# Patient Record
Sex: Female | Born: 1975 | Race: White | Hispanic: No | Marital: Married | Smoking: Current every day smoker
Health system: Southern US, Community
[De-identification: ages and names within clinical notes are randomized; demographics above are authoritative.]

## PROBLEM LIST (undated history)

## (undated) DIAGNOSIS — I10 Essential (primary) hypertension: Secondary | ICD-10-CM

## (undated) DIAGNOSIS — R002 Palpitations: Secondary | ICD-10-CM

## (undated) DIAGNOSIS — F32A Depression, unspecified: Secondary | ICD-10-CM

## (undated) DIAGNOSIS — D649 Anemia, unspecified: Secondary | ICD-10-CM

## (undated) DIAGNOSIS — T4145XA Adverse effect of unspecified anesthetic, initial encounter: Secondary | ICD-10-CM

## (undated) DIAGNOSIS — Z8619 Personal history of other infectious and parasitic diseases: Secondary | ICD-10-CM

## (undated) DIAGNOSIS — F419 Anxiety disorder, unspecified: Secondary | ICD-10-CM

## (undated) DIAGNOSIS — M2550 Pain in unspecified joint: Secondary | ICD-10-CM

## (undated) DIAGNOSIS — T7840XA Allergy, unspecified, initial encounter: Secondary | ICD-10-CM

## (undated) DIAGNOSIS — R519 Headache, unspecified: Secondary | ICD-10-CM

## (undated) DIAGNOSIS — F329 Major depressive disorder, single episode, unspecified: Secondary | ICD-10-CM

## (undated) DIAGNOSIS — J40 Bronchitis, not specified as acute or chronic: Secondary | ICD-10-CM

## (undated) DIAGNOSIS — M779 Enthesopathy, unspecified: Secondary | ICD-10-CM

## (undated) DIAGNOSIS — R51 Headache: Secondary | ICD-10-CM

## (undated) DIAGNOSIS — T8859XA Other complications of anesthesia, initial encounter: Secondary | ICD-10-CM

## (undated) HISTORY — DX: Essential (primary) hypertension: I10

## (undated) HISTORY — DX: Pain in unspecified joint: M25.50

## (undated) HISTORY — DX: Personal history of other infectious and parasitic diseases: Z86.19

## (undated) HISTORY — DX: Unspecified porphyria: E80.20

## (undated) HISTORY — PX: WISDOM TOOTH EXTRACTION: SHX21

## (undated) HISTORY — PX: BREAST REDUCTION SURGERY: SHX8

## (undated) HISTORY — DX: Anemia, unspecified: D64.9

## (undated) HISTORY — DX: Allergy, unspecified, initial encounter: T78.40XA

## (undated) HISTORY — PX: TYMPANOSTOMY TUBE PLACEMENT: SHX32

## (undated) HISTORY — DX: Enthesopathy, unspecified: M77.9

---

## 2015-09-14 DIAGNOSIS — M79671 Pain in right foot: Secondary | ICD-10-CM | POA: Diagnosis not present

## 2015-09-14 DIAGNOSIS — S92501A Displaced unspecified fracture of right lesser toe(s), initial encounter for closed fracture: Secondary | ICD-10-CM | POA: Diagnosis not present

## 2015-09-14 DIAGNOSIS — M25571 Pain in right ankle and joints of right foot: Secondary | ICD-10-CM | POA: Diagnosis not present

## 2015-09-14 DIAGNOSIS — S99921A Unspecified injury of right foot, initial encounter: Secondary | ICD-10-CM | POA: Diagnosis not present

## 2015-09-14 DIAGNOSIS — W108XXA Fall (on) (from) other stairs and steps, initial encounter: Secondary | ICD-10-CM | POA: Diagnosis not present

## 2015-09-18 DIAGNOSIS — S92514A Nondisplaced fracture of proximal phalanx of right lesser toe(s), initial encounter for closed fracture: Secondary | ICD-10-CM | POA: Diagnosis not present

## 2015-09-18 DIAGNOSIS — S92414A Nondisplaced fracture of proximal phalanx of right great toe, initial encounter for closed fracture: Secondary | ICD-10-CM | POA: Diagnosis not present

## 2015-10-31 DIAGNOSIS — S92501A Displaced unspecified fracture of right lesser toe(s), initial encounter for closed fracture: Secondary | ICD-10-CM | POA: Diagnosis not present

## 2015-12-05 DIAGNOSIS — Z23 Encounter for immunization: Secondary | ICD-10-CM | POA: Diagnosis not present

## 2015-12-10 ENCOUNTER — Encounter: Payer: Self-pay | Admitting: General Practice

## 2016-03-10 DIAGNOSIS — Z23 Encounter for immunization: Secondary | ICD-10-CM | POA: Diagnosis not present

## 2016-03-19 ENCOUNTER — Ambulatory Visit (INDEPENDENT_AMBULATORY_CARE_PROVIDER_SITE_OTHER): Payer: BLUE CROSS/BLUE SHIELD | Admitting: Family Medicine

## 2016-03-19 ENCOUNTER — Encounter: Payer: Self-pay | Admitting: Family Medicine

## 2016-03-19 DIAGNOSIS — Z720 Tobacco use: Secondary | ICD-10-CM | POA: Diagnosis not present

## 2016-03-19 DIAGNOSIS — R635 Abnormal weight gain: Secondary | ICD-10-CM

## 2016-03-19 DIAGNOSIS — N920 Excessive and frequent menstruation with regular cycle: Secondary | ICD-10-CM

## 2016-03-19 LAB — CBC WITH DIFFERENTIAL/PLATELET
BASOS PCT: 1.3 % (ref 0.0–3.0)
Basophils Absolute: 0.1 10*3/uL (ref 0.0–0.1)
Eosinophils Absolute: 0.1 10*3/uL (ref 0.0–0.7)
Eosinophils Relative: 0.9 % (ref 0.0–5.0)
HCT: 34.5 % — ABNORMAL LOW (ref 36.0–46.0)
Hemoglobin: 10.9 g/dL — ABNORMAL LOW (ref 12.0–15.0)
LYMPHS ABS: 1.7 10*3/uL (ref 0.7–4.0)
Lymphocytes Relative: 28.2 % (ref 12.0–46.0)
MCHC: 31.5 g/dL (ref 30.0–36.0)
MCV: 79.8 fl (ref 78.0–100.0)
MONO ABS: 0.5 10*3/uL (ref 0.1–1.0)
Monocytes Relative: 8.1 % (ref 3.0–12.0)
NEUTROS PCT: 61.5 % (ref 43.0–77.0)
Neutro Abs: 3.8 10*3/uL (ref 1.4–7.7)
PLATELETS: 149 10*3/uL — AB (ref 150.0–400.0)
RBC: 4.33 Mil/uL (ref 3.87–5.11)
RDW: 18.2 % — AB (ref 11.5–15.5)
WBC: 6.1 10*3/uL (ref 4.0–10.5)

## 2016-03-19 LAB — HEPATIC FUNCTION PANEL
ALT: 29 U/L (ref 0–35)
AST: 22 U/L (ref 0–37)
Albumin: 4.1 g/dL (ref 3.5–5.2)
Alkaline Phosphatase: 63 U/L (ref 39–117)
BILIRUBIN DIRECT: 0.1 mg/dL (ref 0.0–0.3)
BILIRUBIN TOTAL: 0.6 mg/dL (ref 0.2–1.2)
Total Protein: 7 g/dL (ref 6.0–8.3)

## 2016-03-19 LAB — BASIC METABOLIC PANEL
BUN: 15 mg/dL (ref 6–23)
CHLORIDE: 110 meq/L (ref 96–112)
CO2: 26 meq/L (ref 19–32)
Calcium: 9.4 mg/dL (ref 8.4–10.5)
Creatinine, Ser: 0.64 mg/dL (ref 0.40–1.20)
GFR: 107.74 mL/min (ref >60.00)
Glucose, Bld: 83 mg/dL (ref 70–99)
POTASSIUM: 4.1 meq/L (ref 3.5–5.1)
Sodium: 139 mEq/L (ref 135–145)

## 2016-03-19 LAB — LIPID PANEL
CHOL/HDL RATIO: 3
Cholesterol: 191 mg/dL (ref 0–200)
HDL: 61.1 mg/dL (ref >39.00)
LDL Cholesterol: 116 mg/dL — ABNORMAL HIGH (ref 0–99)
NONHDL: 129.93
TRIGLYCERIDES: 70 mg/dL (ref 0.0–149.0)
VLDL: 14 mg/dL (ref 0.0–40.0)

## 2016-03-19 LAB — TSH: TSH: 0.83 u[IU]/mL (ref 0.35–4.50)

## 2016-03-19 NOTE — Patient Instructions (Addendum)
Schedule your complete physical in 6 months We'll notify you of your lab results and make any changes if needed Continue to work on healthy diet and regular exercise- you can do it! We'll call you with your GYN, Derm, nephrology and hematology appts Call with any questions or concerns Welcome!  We're glad to have you!!!

## 2016-03-19 NOTE — Progress Notes (Signed)
   Subjective:    Patient ID: Wendy ChanceJohanna Evans, female    DOB: [birth year], 41 y.o.   MRN: [id number]  HPI New to establish.  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  Previously taking Hydroxychloroquine but stopped.  Has not been seeing anyone recently.  Will rarely get abdominal pain.  Will have itching w/ sun exposure and then 'blood sugar tanks'.  Has some neuropathy of hands.  Weight gain- pt reports going from size 10/12 to 14/16 in a few months.  Not exercising regularly.  Tobacco use- smoking 1/2-1 ppd.  Smoking since age 41.  Pt has the understanding that smoking is helping her porphyria so she is reluctant to quit.   Menorrhagia- pt is interested in possible hysterectomy due to heavy periods and the fact that her porphyria sxs are worse w/ menstrual cycles.   Review of Systems For ROS see HPI     Objective:   Physical Exam  Constitutional: She is oriented to person, place, and time. She appears well-developed and well-nourished. No distress.  Overweight Smells of cigarette smoke  HENT:  Head: Normocephalic and atraumatic.  Eyes: Conjunctivae and EOM are normal. Pupils are equal, round, and reactive to light.  Neck: Normal range of motion. Neck supple. No thyromegaly present.  Cardiovascular: Normal rate, regular rhythm, normal heart sounds and intact distal pulses.   No murmur heard. Pulmonary/Chest: Effort normal and breath sounds normal. No respiratory distress.  Abdominal: Soft. She exhibits no distension. There is no tenderness.  Musculoskeletal: She exhibits no edema.  Lymphadenopathy:    She has no cervical adenopathy.  Neurological: She is alert and oriented to person, place, and time.  Skin: Skin is warm and dry.  Psychiatric: She has a normal mood and affect. Her behavior is normal.  Vitals reviewed.         Assessment & Plan:

## 2016-03-19 NOTE — Progress Notes (Signed)
Pre visit review using our clinic review tool, if applicable. No additional management support is needed unless otherwise documented below in the visit note. 

## 2016-03-23 NOTE — Assessment & Plan Note (Signed)
New to provider, ongoing for pt since age 41.  Pt has the idea that smoking is protective in her porphyria so she is reluctant to quit.  Stressed the need to do so.  Will follow.

## 2016-03-23 NOTE — Assessment & Plan Note (Signed)
New.  Pt admits to infrequent exercise and not following a particular diet.  Stressed the need for both.  Check labs to determine if she has a thyroid or metabolic abnormality contributing.  Will follow.

## 2016-03-23 NOTE — Assessment & Plan Note (Signed)
New to provider, ongoing for pt.  She has very heavy periods and her porphyria sxs are worse w/ menstrual cycles and hormonal fluctuations.  Referral to GYN placed.

## 2016-03-23 NOTE — Assessment & Plan Note (Signed)
New to provider, ongoing for pt.  She has not been seeing hematology or derm regularly for this condition.  She has skin lesions w/ sun exposure, labile blood sugars, and neuropathy due to her condition but thankfully, she rarely has abdominal pain.  I did not see any information about smoking being protective in this condition- in fact, I saw that it could actually trigger an attack.  But at this time, she is not willing to quit.  Refer to derm, nephrology, hematology for evaluation and ongoing tx.  Pt expressed understanding and is in agreement w/ plan.

## 2016-03-24 ENCOUNTER — Encounter: Payer: Self-pay | Admitting: Family Medicine

## 2016-03-31 DIAGNOSIS — J069 Acute upper respiratory infection, unspecified: Secondary | ICD-10-CM | POA: Diagnosis not present

## 2016-04-01 ENCOUNTER — Encounter: Payer: Self-pay | Admitting: Family Medicine

## 2016-04-08 ENCOUNTER — Telehealth: Payer: Self-pay | Admitting: Family Medicine

## 2016-04-08 NOTE — Telephone Encounter (Signed)
Dr Kathrene BongoGoldsborough called regarding pt's nephrology referral.  Due to normal creatinine, they don't feel that pt needs to be seen at this time.  We discussed that we will periodically monitor her labs and refer if and when we see a change in kidney function.

## 2016-04-09 NOTE — Telephone Encounter (Signed)
Patient notified of PCP recommendations and is agreement and expresses an understanding.  

## 2016-04-15 ENCOUNTER — Encounter: Payer: Self-pay | Admitting: Family Medicine

## 2016-04-15 DIAGNOSIS — Z881 Allergy status to other antibiotic agents status: Secondary | ICD-10-CM | POA: Diagnosis not present

## 2016-04-15 DIAGNOSIS — N921 Excessive and frequent menstruation with irregular cycle: Secondary | ICD-10-CM | POA: Diagnosis not present

## 2016-04-15 DIAGNOSIS — Z91040 Latex allergy status: Secondary | ICD-10-CM | POA: Diagnosis not present

## 2016-04-15 DIAGNOSIS — F17200 Nicotine dependence, unspecified, uncomplicated: Secondary | ICD-10-CM | POA: Diagnosis not present

## 2016-04-15 DIAGNOSIS — Z886 Allergy status to analgesic agent status: Secondary | ICD-10-CM | POA: Diagnosis not present

## 2016-04-15 DIAGNOSIS — Z888 Allergy status to other drugs, medicaments and biological substances status: Secondary | ICD-10-CM | POA: Diagnosis not present

## 2016-04-15 DIAGNOSIS — Z885 Allergy status to narcotic agent status: Secondary | ICD-10-CM | POA: Diagnosis not present

## 2016-04-15 DIAGNOSIS — Z880 Allergy status to penicillin: Secondary | ICD-10-CM | POA: Diagnosis not present

## 2016-04-15 DIAGNOSIS — Z882 Allergy status to sulfonamides status: Secondary | ICD-10-CM | POA: Diagnosis not present

## 2016-04-15 DIAGNOSIS — G629 Polyneuropathy, unspecified: Secondary | ICD-10-CM | POA: Diagnosis not present

## 2016-04-15 LAB — BASIC METABOLIC PANEL
BUN: 12 mg/dL (ref 4–21)
CREATININE: 0.7 mg/dL (ref 0.5–1.1)
GLUCOSE: 95 mg/dL
Potassium: 3.8 mmol/L (ref 3.4–5.3)
SODIUM: 139 mmol/L (ref 137–147)

## 2016-04-15 LAB — LIPID PANEL
Cholesterol: 174 mg/dL (ref 0–200)
HDL: 57 mg/dL (ref 35–70)
LDL CALC: 111 mg/dL
LDL/HDL RATIO: 3.1
Triglycerides: 75 mg/dL (ref 40–160)

## 2016-04-15 LAB — HEPATIC FUNCTION PANEL
ALT: 21 U/L (ref 7–35)
AST: 21 U/L (ref 13–35)
Alkaline Phosphatase: 60 U/L (ref 25–125)
BILIRUBIN, TOTAL: 0.5 mg/dL

## 2016-04-15 LAB — CBC AND DIFFERENTIAL
HEMATOCRIT: 33 % — AB (ref 36–46)
Hemoglobin: 10.1 g/dL — AB (ref 12.0–16.0)
PLATELETS: 166 10*3/uL (ref 150–399)
WBC: 7.4 10^3/mL

## 2016-04-15 LAB — POCT INR: INR: 1 (ref 0.9–1.1)

## 2016-04-16 ENCOUNTER — Telehealth: Payer: Self-pay | Admitting: Hematology & Oncology

## 2016-04-16 NOTE — Telephone Encounter (Signed)
Patient called after hours and left a message with [facility]. Patient wants to cancel appt with Dr. Myna HidalgoEnnever.       [facility] MARCH 2018  [facility]

## 2016-04-17 ENCOUNTER — Ambulatory Visit: Payer: BLUE CROSS/BLUE SHIELD

## 2016-04-17 ENCOUNTER — Ambulatory Visit: Payer: BLUE CROSS/BLUE SHIELD | Admitting: Hematology & Oncology

## 2016-04-17 ENCOUNTER — Other Ambulatory Visit: Payer: BLUE CROSS/BLUE SHIELD

## 2016-05-12 ENCOUNTER — Encounter: Payer: Self-pay | Admitting: General Practice

## 2016-05-18 DIAGNOSIS — N920 Excessive and frequent menstruation with regular cycle: Secondary | ICD-10-CM | POA: Diagnosis not present

## 2016-05-18 DIAGNOSIS — Z01419 Encounter for gynecological examination (general) (routine) without abnormal findings: Secondary | ICD-10-CM | POA: Diagnosis not present

## 2016-05-18 DIAGNOSIS — Z6827 Body mass index (BMI) 27.0-27.9, adult: Secondary | ICD-10-CM | POA: Diagnosis not present

## 2016-05-18 LAB — HM PAP SMEAR

## 2016-05-21 ENCOUNTER — Telehealth: Payer: Self-pay | Admitting: Family Medicine

## 2016-05-21 NOTE — Telephone Encounter (Signed)
Called pt and left a detailed message to inform. Advised her to possibly contact health department to see if they might have the vaccination without the neomycin.

## 2016-05-21 NOTE — Telephone Encounter (Signed)
Our Havrix does have trace elements of Neomycin so depending on reaction, she can't have this

## 2016-05-21 NOTE — Telephone Encounter (Signed)
Called patient as instructed to schedule appt for Hep A vaccine.  Patient has been schedule for 06/02/16.   However, she is asking if the Hep A vaccine contains neomycin or any mycin drugs or any other preservatives. She states she is severely allergic to medicines in the mycin family and needs to be sure this vaccine does not have any in it.

## 2016-06-02 ENCOUNTER — Ambulatory Visit: Payer: BLUE CROSS/BLUE SHIELD

## 2016-06-11 ENCOUNTER — Encounter: Payer: Self-pay | Admitting: Family Medicine

## 2016-06-19 DIAGNOSIS — N924 Excessive bleeding in the premenopausal period: Secondary | ICD-10-CM | POA: Diagnosis not present

## 2016-06-19 DIAGNOSIS — N946 Dysmenorrhea, unspecified: Secondary | ICD-10-CM | POA: Diagnosis not present

## 2016-06-24 ENCOUNTER — Telehealth: Payer: Self-pay | Admitting: *Deleted

## 2016-06-24 NOTE — Telephone Encounter (Signed)
Patient wanting to know if she still needs to go to Dermatology.  She states that she has seen Dr. Joellyn RuedBonkovsky and she is wondering if she still needs to see the dermatologist . She states  She  Tried sending a Clinical cytogeneticistmychart message and Wendy Reapermychart is down so she could not access it.

## 2016-06-24 NOTE — Telephone Encounter (Signed)
Called patient and LMOVM to return call.     

## 2016-06-24 NOTE — Telephone Encounter (Signed)
Patient returned call for information... Patient made aware and stated verbal understanding.

## 2016-06-24 NOTE — Telephone Encounter (Signed)
If she doesn't see the need to see Derm and things are going well with her porphyria specialist, she can cancel the appt.  If she has any areas of concern on her skin, it would be worthwhile to keep the appt

## 2016-06-30 DIAGNOSIS — D225 Melanocytic nevi of trunk: Secondary | ICD-10-CM | POA: Diagnosis not present

## 2016-06-30 DIAGNOSIS — L858 Other specified epidermal thickening: Secondary | ICD-10-CM | POA: Diagnosis not present

## 2016-06-30 DIAGNOSIS — D1800 Hemangioma unspecified site: Secondary | ICD-10-CM | POA: Diagnosis not present

## 2016-07-10 DIAGNOSIS — J40 Bronchitis, not specified as acute or chronic: Secondary | ICD-10-CM

## 2016-07-10 HISTORY — DX: Bronchitis, not specified as acute or chronic: J40

## 2016-07-15 ENCOUNTER — Ambulatory Visit (INDEPENDENT_AMBULATORY_CARE_PROVIDER_SITE_OTHER): Payer: BLUE CROSS/BLUE SHIELD | Admitting: Family Medicine

## 2016-07-15 ENCOUNTER — Encounter: Payer: Self-pay | Admitting: Family Medicine

## 2016-07-15 VITALS — BP 160/99 | HR 86 | Temp 98.5°F | Resp 20 | Wt 160.2 lb

## 2016-07-15 DIAGNOSIS — J40 Bronchitis, not specified as acute or chronic: Secondary | ICD-10-CM | POA: Diagnosis not present

## 2016-07-15 MED ORDER — IPRATROPIUM-ALBUTEROL 0.5-2.5 (3) MG/3ML IN SOLN
3.0000 mL | Freq: Once | RESPIRATORY_TRACT | Status: AC
  Administered 2016-07-15: 3 mL via RESPIRATORY_TRACT

## 2016-07-15 MED ORDER — PREDNISONE 50 MG PO TABS: ORAL_TABLET | ORAL | 0 refills | Status: DC

## 2016-07-15 MED ORDER — DOXYCYCLINE HYCLATE 100 MG PO TABS
100.0000 mg | ORAL_TABLET | Freq: Two times a day (BID) | ORAL | 0 refills | Status: DC
Start: 2016-07-15 — End: 2016-08-14

## 2016-07-15 NOTE — Patient Instructions (Signed)
Continue zyrtec and flonase.  You can use Mucinex DM for cough and phlegm production.  Prednisone with food for 5 days Doxycyline every 12 hours 10 days.     Acute Bronchitis, Adult Acute bronchitis is when air tubes (bronchi) in the lungs suddenly get swollen. The condition can make it hard to breathe. It can also cause these symptoms:  A cough.  Coughing up clear, yellow, or green mucus.  Wheezing.  Chest congestion.  Shortness of breath.  A fever.  Body aches.  Chills.  A sore throat.  Follow these instructions at home: Medicines  Take over-the-counter and prescription medicines only as told by your doctor.  If you were prescribed an antibiotic medicine, take it as told by your doctor. Do not stop taking the antibiotic even if you start to feel better. General instructions  Rest.  Drink enough fluids to keep your pee (urine) clear or pale yellow.  Avoid smoking and secondhand smoke. If you smoke and you need help quitting, ask your doctor. Quitting will help your lungs heal faster.  Use an inhaler, cool mist vaporizer, or humidifier as told by your doctor.  Keep all follow-up visits as told by your doctor. This is important. How is this prevented? To lower your risk of getting this condition again:  Wash your hands often with soap and water. If you cannot use soap and water, use hand sanitizer.  Avoid contact with people who have cold symptoms.  Try not to touch your hands to your mouth, nose, or eyes.  Make sure to get the flu shot every year.  Contact a doctor if:  Your symptoms do not get better in 2 weeks. Get help right away if:  You cough up blood.  You have chest pain.  You have very bad shortness of breath.  You become dehydrated.  You faint (pass out) or keep feeling like you are going to pass out.  You keep throwing up (vomiting).  You have a very bad headache.  Your fever or chills gets worse. This information is not intended  to replace advice given to you by your health care provider. Make sure you discuss any questions you have with your health care provider. Document Released: 07/15/2007 Document Revised: 09/04/2015 Document Reviewed: 07/17/2015 Elsevier Interactive Patient Education  2017 [employer]

## 2016-07-15 NOTE — Progress Notes (Signed)
Wendy Evans , 03-Mar-1975, 41 y.o., female MRN: [id number] Patient Care Team    Relationship Specialty Notifications Start End  Sheliah Hatchabori, Katherine E, MD PCP - General Family Medicine  03/19/16     Chief Complaint  Patient presents with  . URI    drainage,cough x 2 weeks      Subjective: Pt presents for an OV with complaints cough of 2 weeks duration.  Associated symptoms include last week had nasal drainage, sinus pain/pressure. Over the last few days has become more fatigued, hoarseness of voice and cough is productive. She denies fever, chills, nausea or vomit. Pt reports her allergies are worse this year. She is taking zyrtec. She reports she has coughing fits that can last quite a few minutes. She denies shortness of breath, wheezing or h/o asthma. She has many allergies.   [facility]/2016 03/19/2016  Decreased Interest - 0  Down, Depressed, Hopeless 1 0  PHQ - 2 Score 1 0  Altered sleeping - 0  Tired, decreased energy - 0  Change in appetite - 0  Feeling bad or failure about yourself  - 0  Trouble concentrating - 0  Moving slowly or fidgety/restless - 0  Suicidal thoughts - 0  PHQ-9 Score - 0    Allergies  Allergen Reactions  . Amoxicillin Nausea And Vomiting  . 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  . Propoxyphene Nausea And Vomiting  . Codeine   . Erythromycin   . Latex   . Meperidine Other (See Comments)  . Other     Darvocet   . Oxytocin Other (See Comments)  . Penicillins   . Sulfa Antibiotics   . Vancomycin Other (See Comments)   Social History  Substance Use Topics  . Smoking status: Current Every Day Smoker  . Smokeless tobacco: Never Used  . Alcohol use Yes   Past Medical History:  Diagnosis Date  . Allergy   . Anemia   . History of chicken pox   . Hypertension   . Joint pain   . Porphyria variegata (HCC)   . Tendonitis    Past Surgical History:  Procedure Laterality Date  . BREAST REDUCTION  SURGERY    . TYMPANOSTOMY TUBE PLACEMENT     Family History  Problem Relation Age of Onset  . Hypertension Maternal Grandmother   . Hyperlipidemia Maternal Grandmother   . Cancer Maternal Grandmother        skin  . Dementia Maternal Grandmother   . Heart disease Maternal Grandmother        valve replacement  . Hypertension Maternal Grandfather   . Diabetes Maternal Grandfather    Allergies as of 07/15/2016      Reactions   Amoxicillin Nausea And Vomiting   Nsaids Other (See Comments)   Because has Variegate Porphyria   Propoxyphene Nausea And Vomiting   Codeine    Erythromycin    Latex    Meperidine Other (See Comments)   Other    Darvocet   Oxytocin Other (See Comments)   Penicillins    Sulfa Antibiotics    Vancomycin Other (See Comments)      Medication List       Accurate as of 07/15/16 11:26 AM. Always use your most recent med list.          ferrous sulfate 325 (65 FE) MG tablet Take 325 mg by mouth daily with breakfast.   vitamin C 1000 MG tablet Take  1,000 mg by mouth daily.   VITAMIN D PO Take by mouth.       All past medical history, surgical history, allergies, family history, immunizations andmedications were updated in the EMR today and reviewed under the history and medication portions of their EMR.     ROS: Negative, with the exception of above mentioned in HPI   Objective:  BP (!) 160/99 (BP Location: Left Arm, Patient Position: Sitting, Cuff Size: Normal)   Pulse 86   Temp 98.5 F (36.9 C)   Resp 20   Wt 160 lb 4 oz (72.7 kg)   SpO2 99%   BMI 28.39 kg/m  Body mass index is 28.39 kg/m. Gen: Afebrile. No acute distress. Nontoxic in appearance, well developed, well nourished.  HENT: AT. [place]. Bilateral TM visualized Without erythema or fullness. MMM, no oral lesions. Bilateral nares without erythema, mild swelling, mild drainage. Throat without erythema or exudates. Moderate cough present, hoarseness present, tender to palpation bilateral  maxillary sinus. Eyes:Pupils Equal Round Reactive to light, Extraocular movements intact,  Conjunctiva without redness, discharge or icterus. Neck/lymp/endocrine: Supple, no lymphadenopathy CV: RRR Chest: Mild wheezing, moderate to severe cough with Inspiration. Difficult to appreciate air movement with cough. Abd: Soft. NTND. BS present.  Skin: No rashes, purpura or petechiae.  Neuro:  Normal gait. PERLA. EOMi. Alert. Oriented x3   No exam data present No results found. No results found for this or any previous visit (from the past 24 hour(s)).  Assessment/Plan: Wendy Evans is a 41 y.o. female present for OV for  Bronchitis - Rest, hydrate. Doxycycline twice a day 10 days, prednisone burst. -Patient DM and Flonase encouraged, as well as Zyrtec continue daily. -Patient had DuoNeb treatment in the office, which opened up her airways nicely, calmed down the cough and wheezing resolved. - She has some mild symptoms of sinusitis as well, just doxycycline to give coverage for both upper and lower respiratory tracts. - Follow-up with PCP in 1-2 weeks if not improving, sooner if worsening.   Reviewed expectations re: course of current medical issues.  Discussed self-management of symptoms.  Outlined signs and symptoms indicating need for more acute intervention.  Patient verbalized understanding and all questions were answered.  Patient received an After-Visit Summary.    Note is dictated utilizing voice recognition software. Although note has been proof read prior to signing, occasional typographical errors still can be missed. If any questions arise, please do not hesitate to call for verification.   electronically signed by:  Felix Pacini, DO  [facility] - [place]

## 2016-07-16 ENCOUNTER — Encounter: Payer: Self-pay | Admitting: Family Medicine

## 2016-07-24 ENCOUNTER — Encounter: Payer: Self-pay | Admitting: General Practice

## 2016-07-24 ENCOUNTER — Encounter: Payer: Self-pay | Admitting: Family Medicine

## 2016-07-24 DIAGNOSIS — Z1231 Encounter for screening mammogram for malignant neoplasm of breast: Secondary | ICD-10-CM | POA: Diagnosis not present

## 2016-07-24 LAB — HM MAMMOGRAPHY: HM Mammogram: NORMAL (ref 0–4)

## 2016-07-31 ENCOUNTER — Encounter: Payer: Self-pay | Admitting: General Practice

## 2016-08-06 NOTE — H&P (Signed)
41 year old female with menorrhagia. History of variegate porphyria.  Past Medical History:  Diagnosis Date  . Allergy   . Anemia   . History of chicken pox   . Hypertension   . Joint pain   . Porphyria variegata (HCC)   . Tendonitis    Past Surgical History:  Procedure Laterality Date  . BREAST REDUCTION SURGERY    . TYMPANOSTOMY TUBE PLACEMENT     Prior to Admission medications   Medication Sig Start Date End Date Taking? Authorizing Provider  Ascorbic Acid (VITAMIN C) 1000 MG tablet Take 1,000 mg by mouth daily.    [provider]  Cholecalciferol (VITAMIN D PO) Take by mouth.    [provider]  doxycycline (VIBRA-TABS) 100 MG tablet Take 1 tablet (100 mg total) by mouth 2 (two) times daily. 07/15/16   Kuneff, Renee A, DO  ferrous sulfate 325 (65 FE) MG tablet Take 325 mg by mouth daily with breakfast.    [provider]  predniSONE (DELTASONE) 50 MG tablet 1 tab daily with food. 07/15/16   Kuneff, Renee A, DO   Amoxicillin; Nsaids; Propoxyphene; Codeine; Erythromycin; Latex; Meperidine; Other; Oxytocin; Penicillins; Sulfa antibiotics; and Vancomycin  Family History  Problem Relation Age of Onset  . Hypertension Maternal Grandmother   . Hyperlipidemia Maternal Grandmother   . Cancer Maternal Grandmother        skin  . Dementia Maternal Grandmother   . Heart disease Maternal Grandmother        valve replacement  . Hypertension Maternal Grandfather   . Diabetes Maternal Grandfather    Social History   Social History  . Marital status: Married    Spouse name: N/A  . Number of children: N/A  . Years of education: N/A   Social History Main Topics  . Smoking status: Current Every Day Smoker  . Smokeless tobacco: Never Used  . Alcohol use Yes  . Drug use: No  . Sexual activity: No   Other Topics Concern  . Not on file   Social History Narrative  . No narrative on file   vss General alert and oriented Lung CTAB Car RRR Abdomen is soft  and non tender Pelvic WNL  IMPRESSION: Menorrhagia  PLAN: D and C, Hysteroscopy, HTA  Risks reviewed Consent signed

## 2016-08-11 NOTE — Patient Instructions (Signed)
Your procedure is scheduled on:  Friday, August 21, 2016  Enter through the Main [facility]trance of [facility] at:  6:00 AM  Pick up the phone at the desk and dial [phone].  Call this number if you have problems the morning of surgery: [phone].  Remember: Do NOT eat food or drink after:  Midnight Thursday  Take these medicines the morning of surgery with a SIP OF WATER:  Prednisone, Xanax if needed  Stop ALL herbal medications and Vitamin C at this time  Do NOT smoke the day of surgery.  Do NOT wear jewelry (body piercing), metal hair clips/bobby pins, make-up, artifical eyelashes or nail polish. Do NOT wear lotions, powders, or perfumes.  You may wear deodorant. Do NOT shave for 48 hours prior to surgery. Do NOT bring valuables to the hospital. Contacts, dentures, or bridgework may not be worn into surgery.  Have a responsible adult drive you home and stay with you for 24 hours after your procedure  Bring a copy of your healthcare power of attorney and living will documents.

## 2016-08-14 ENCOUNTER — Inpatient Hospital Stay (HOSPITAL_COMMUNITY)
Admission: RE | Admit: 2016-08-14 | Discharge: 2016-08-14 | Disposition: A | Discharge status: Home / Self Care | Payer: BLUE CROSS/BLUE SHIELD | Source: Ambulatory Visit

## 2016-08-14 ENCOUNTER — Encounter: Payer: Self-pay | Admitting: Physician Assistant

## 2016-08-14 ENCOUNTER — Ambulatory Visit (INDEPENDENT_AMBULATORY_CARE_PROVIDER_SITE_OTHER): Payer: BLUE CROSS/BLUE SHIELD | Admitting: Physician Assistant

## 2016-08-14 VITALS — BP 150/98 | HR 76 | Temp 98.7°F | Resp 14 | Ht 63.0 in | Wt 159.0 lb

## 2016-08-14 DIAGNOSIS — J0100 Acute maxillary sinusitis, unspecified: Secondary | ICD-10-CM

## 2016-08-14 MED ORDER — OXYCODONE-ACETAMINOPHEN 5-325 MG PO TABS: 1.0000 | ORAL_TABLET | Freq: Three times a day (TID) | ORAL | 0 refills | Status: DC | PRN

## 2016-08-14 MED ORDER — DOXYCYCLINE HYCLATE 100 MG PO CAPS
100.0000 mg | ORAL_CAPSULE | Freq: Two times a day (BID) | ORAL | 0 refills | Status: DC
Start: 2016-08-14 — End: 2016-10-01

## 2016-08-14 NOTE — Progress Notes (Signed)
Patient presents to clinic today c/o pain in upper R teeth/gum starting yesterday. Started noting R facial soreness that worsened throughout the day. Is having worsening pain today without fever, chills. Endorses R maxillary sinus pressure/pain. Has history of similar dental pain that was finally attributed to sinus infection. Patient took 1/2 percocet yesterday left over from dental surgery with some improvement. Patient does note mild nasal congestion and R ear pressure/pain. Denies cough or other URI symptoms. Patient attempted to reach her dentist today without success.  Past Medical History:  Diagnosis Date  . Allergy   . Anemia   . History of chicken pox   . Hypertension   . Joint pain   . Porphyria variegata (HCC)   . Tendonitis     Current Outpatient Prescriptions on File Prior to Visit  Medication Sig Dispense Refill  . acetaminophen (TYLENOL) 500 MG tablet Take 500-1,000 mg by mouth every 6 (six) hours as needed for moderate pain.    Marland Kitchen ALPRAZolam (XANAX) 0.25 MG tablet Take 0.25 mg by mouth daily as needed for anxiety.    . cetirizine (ZYRTEC) 10 MG tablet Take 10 mg by mouth daily.    . cholecalciferol (VITAMIN D) 1000 units tablet Take 1,000 Units by mouth daily.    . ferrous sulfate 325 (65 FE) MG tablet Take 325 mg by mouth daily with breakfast.    . ketotifen (ALAWAY) 0.025 % ophthalmic solution Place 1 drop into both eyes 2 (two) times daily as needed (allergies).    . vitamin C (ASCORBIC ACID) 500 MG tablet Take 500 mg by mouth daily.     No current facility-administered medications on file prior to visit.     Allergies  Allergen Reactions  . Codeine Nausea And Vomiting    Vomits to point of tearing stomach lining, causing bleeding   . Erythromycin Anaphylaxis, Swelling and Rash    Any "mycins" including Neomycin  . Vancomycin Anaphylaxis  . Amoxicillin Rash  . Latex Itching    Severity increases when in contact with moisture: fluid, sweat, etc.   . Nsaids  Other (See Comments)    Because has Variegate Porphyria  . Propoxyphene Nausea And Vomiting  . Banana Other (See Comments)    Severe muscle pain and cramps   . Hydrocodone Other (See Comments)    Because Has Variegate Porphyria  . Meperidine Other (See Comments)    Unknown  . Other     Darvocet   . Oxytocin Itching  . Penicillins Rash    Childhood allergy Has patient had a PCN reaction causing immediate rash, facial/tongue/throat swelling, SOB or lightheadedness with hypotension: Unknown Has patient had a PCN reaction causing severe rash involving mucus membranes or skin necrosis: Unknown Has patient had a PCN reaction that required hospitalization: No Has patient had a PCN reaction occurring within the last 10 years: No If all of the above answers are "NO", then may proceed with Cephalosporin use.   . Sulfa Antibiotics Rash    Family History  Problem Relation Age of Onset  . Hypertension Maternal Grandmother   . Hyperlipidemia Maternal Grandmother   . Cancer Maternal Grandmother        skin  . Dementia Maternal Grandmother   . Heart disease Maternal Grandmother        valve replacement  . Hypertension Maternal Grandfather   . Diabetes Maternal Grandfather     Social History   Social History  . Marital status: Married    Spouse name: N/A  .  Number of children: N/A  . Years of education: N/A   Social History Main Topics  . Smoking status: Current Every Day Smoker  . Smokeless tobacco: Never Used  . Alcohol use Yes  . Drug use: No  . Sexual activity: No   Other Topics Concern  . None   Social History Narrative  . None   Review of Systems - See HPI.  All other ROS are negative.  BP (!) 150/98   Pulse 76   Temp 98.7 F (37.1 C) (Oral)   Resp 14   Ht 5\' 3"  (1.6 m)   Wt 159 lb (72.1 kg)   SpO2 99%   BMI 28.17 kg/m   Physical Exam  Constitutional: She is oriented to person, place, and time and well-developed, well-nourished, and in no distress.  HENT:   Head: Normocephalic and atraumatic.  Right Ear: External ear normal.  Left Ear: External ear normal.  Nose: Right sinus exhibits maxillary sinus tenderness (significant).  Mouth/Throat: Uvula is midline, oropharynx is clear and moist and mucous membranes are normal.  Poor dentition noted without sign of abscess or bony abnormality.  Eyes: Conjunctivae are normal.  Neck: Neck supple.  Cardiovascular: Normal rate, regular rhythm, normal heart sounds and intact distal pulses.   Pulmonary/Chest: Effort normal and breath sounds normal. No respiratory distress. She has no wheezes. She has no rales. She exhibits no tenderness.  Neurological: She is alert and oriented to person, place, and time.  Skin: Skin is warm and dry. No rash noted.  Psychiatric: Affect normal.  Vitals reviewed.   Recent Results (from the past 2160 hour(s))  HM PAP SMEAR     Status: None   Collection Time: 05/18/16 12:00 AM  Result Value Ref Range   HM Pap smear completed   HM PAP SMEAR     Status: None   Collection Time: 05/18/16 12:00 AM  Result Value Ref Range   HM Pap smear completed   HM MAMMOGRAPHY     Status: None   Collection Time: 07/24/16 12:00 AM  Result Value Ref Range   HM Mammogram Self Reported Normal 0-4 Bi-Rad, Self Reported Normal    Assessment/Plan: 1. Acute maxillary sinusitis, recurrence not specified Start doxycycline. Pain medication reviewed with patient -- patient endorses tolerating percocet but not hydrocodone. Will allow 5 tablets. She needs to schedule follow-up with Dentist giving her history but no sign of active dental infection on examination. ER if anything worsens.  - doxycycline (VIBRAMYCIN) 100 MG capsule; Take 1 capsule (100 mg total) by mouth 2 (two) times daily.  Dispense: 20 capsule; Refill: 0   Piedad ClimesMartin, Aldine Chakraborty [place], [place] JerseyPA-C

## 2016-08-14 NOTE — Patient Instructions (Signed)
Please take antibiotic as directed.  Increase fluid intake.  Use Saline nasal spray.  Take a daily multivitamin. 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  Place a humidifier in the bedroom.  Please call or return clinic if symptoms are not improving.  Sinusitis Sinusitis is redness, soreness, and swelling (inflammation) of the paranasal sinuses. Paranasal sinuses are air pockets within the bones of your face (beneath the eyes, the middle of the forehead, or above the eyes). In healthy paranasal sinuses, mucus is able to drain out, and air is able to circulate through them by way of your nose. However, when your paranasal sinuses are inflamed, mucus and air can become trapped. This can allow bacteria and other germs to grow and cause infection. Sinusitis can develop quickly and last only a short time (acute) or continue over a long period (chronic). Sinusitis that lasts for more than 12 weeks is considered chronic.  CAUSES  Causes of sinusitis include:  Allergies.  Structural abnormalities, such as displacement of the cartilage that separates your nostrils (deviated septum), which can decrease the air flow through your nose and sinuses and affect sinus drainage.  Functional abnormalities, such as when the small hairs (cilia) that line your sinuses and help remove mucus do not work properly or are not present. SYMPTOMS  Symptoms of acute and chronic sinusitis are the same. The primary symptoms are pain and pressure around the affected sinuses. Other symptoms include:  Upper toothache.  Earache.  Headache.  Bad breath.  Decreased sense of smell and taste.  A cough, which worsens when you are lying flat.  Fatigue.  Fever.  Thick drainage from your nose, which often is green and may contain pus (purulent).  Swelling and warmth over the affected sinuses. DIAGNOSIS  Your caregiver will  perform a physical exam. During the exam, your caregiver may:  Look in your nose for signs of abnormal growths in your nostrils (nasal polyps).  Tap over the affected sinus to check for signs of infection.  View the inside of your sinuses (endoscopy) with a special imaging device with a light attached (endoscope), which is inserted into your sinuses. If your caregiver suspects that you have chronic sinusitis, one or more of the following tests may be recommended:  Allergy tests.  Nasal culture A sample of mucus is taken from your nose and sent to a lab and screened for bacteria.  Nasal cytology A sample of mucus is taken from your nose and examined by your caregiver to determine if your sinusitis is related to an allergy. TREATMENT  Most cases of acute sinusitis are related to a viral infection and will resolve on their own within 10 days. Sometimes medicines are prescribed to help relieve symptoms (pain medicine, decongestants, nasal steroid sprays, or saline sprays).  However, for sinusitis related to a bacterial infection, your caregiver will prescribe antibiotic medicines. These are medicines that will help kill the bacteria causing the infection.  Rarely, sinusitis is caused by a fungal infection. In theses cases, your caregiver will prescribe antifungal medicine. For some cases of chronic sinusitis, surgery is needed. Generally, these are cases in which sinusitis recurs more than 3 times per year, despite other treatments. HOME CARE INSTRUCTIONS   Drink plenty of water. Water helps thin the mucus so your sinuses can drain more easily.  Use a humidifier.  Inhale steam 3 to 4 times a day (for  example, sit in the bathroom with the shower running).  Apply a warm, moist washcloth to your face 3 to 4 times a day, or as directed by your caregiver.  Use saline nasal sprays to help moisten and clean your sinuses.  Take over-the-counter or prescription medicines for pain, discomfort, or  fever only as directed by your caregiver. SEEK IMMEDIATE MEDICAL CARE IF:  You have increasing pain or severe headaches.  You have nausea, vomiting, or drowsiness.  You have swelling around your face.  You have vision problems.  You have a stiff neck.  You have difficulty breathing. MAKE SURE YOU:   Understand these instructions.  Will watch your condition.  Will get help right away if you are not doing well or get worse. Document Released: 01/26/2005 Document Revised: 04/20/2011 Document Reviewed: 02/10/2011 [facility] Patient Information 2014 [place], [place].

## 2016-08-14 NOTE — Progress Notes (Signed)
Pre visit review using our clinic review tool, if applicable. No additional management support is needed unless otherwise documented below in the visit note. 

## 2016-08-17 DIAGNOSIS — N920 Excessive and frequent menstruation with regular cycle: Secondary | ICD-10-CM | POA: Diagnosis not present

## 2016-09-06 DIAGNOSIS — Z880 Allergy status to penicillin: Secondary | ICD-10-CM | POA: Diagnosis not present

## 2016-09-06 DIAGNOSIS — Z91040 Latex allergy status: Secondary | ICD-10-CM | POA: Diagnosis not present

## 2016-09-06 DIAGNOSIS — R220 Localized swelling, mass and lump, head: Secondary | ICD-10-CM | POA: Diagnosis not present

## 2016-09-06 DIAGNOSIS — Z885 Allergy status to narcotic agent status: Secondary | ICD-10-CM | POA: Diagnosis not present

## 2016-09-06 DIAGNOSIS — Z882 Allergy status to sulfonamides status: Secondary | ICD-10-CM | POA: Diagnosis not present

## 2016-09-06 DIAGNOSIS — F17210 Nicotine dependence, cigarettes, uncomplicated: Secondary | ICD-10-CM | POA: Diagnosis not present

## 2016-09-06 DIAGNOSIS — Z881 Allergy status to other antibiotic agents status: Secondary | ICD-10-CM | POA: Diagnosis not present

## 2016-09-06 DIAGNOSIS — Z720 Tobacco use: Secondary | ICD-10-CM | POA: Diagnosis not present

## 2016-09-06 DIAGNOSIS — K047 Periapical abscess without sinus: Secondary | ICD-10-CM | POA: Diagnosis not present

## 2016-09-07 ENCOUNTER — Telehealth: Payer: Self-pay | Admitting: Emergency Medicine

## 2016-09-07 NOTE — Telephone Encounter (Signed)
Patient went to the ER-[place]. She received Clindamycin 300 mg. She only had 2 doses of medication. The swelling has not gone down. She was advised if the swelling has not gone down in 24 hrs to contact PCP. After speaking with patient advising her she needs to see a Dentist. She has difficulties with her current Dentist and not getting anything complete at a reasonable cost. She will contact us if the dentist is unable to do anything about the area. She will call back to schedule an appointment.      Spoke with patient this morning from a [employer] phone call placed on 09/06/16 at 12:22pm Patient called saying she was seen a couple of weeks ago for a swollen left jaw and cheek. The swelling started again yesterday but today it is really swollen, whole she does have a pocket of puss and she "pooped it". She was told if this continued that she may need to see someone. She needs to know if she should go to the ER. She was advised to go to the ER

## 2016-09-14 ENCOUNTER — Encounter: Payer: BLUE CROSS/BLUE SHIELD | Admitting: Family Medicine

## 2016-09-24 ENCOUNTER — Telehealth: Payer: Self-pay | Admitting: General Practice

## 2016-09-24 NOTE — Telephone Encounter (Signed)
Pt called to the office hysterically crying stating that she had reached rock bottom. [employer] personnel came and found me to speak with pt.   Pt was advised to pull off to side of the road so we could speak. Pt pulled into the parking lot of a restaurant. After 10 minutes pt breathing was regulated so we could discuss what had caused her anxiety/panic attack.   Pt advised that there was a court hearing today in regards to the well being of her 2 niece's that she has custody of (started 2 months ago). Pt states that she is having a hard time as her family is blaming her and her husband for the niece's not being returned to their father, she and her husband are facing monetary hardships and are very worried that they cannot continue to care for their 43 year old niece who has down syndrome. Pt stated that she felt as though she had hit rock bottom and was not sure how she could continue on the path she was on. Pt denied any suicidal ideations.   Pt remained on the phone with me for 20 minutes while I calmed her down. Pt was given an appointment with PCP tomorrow as there was not an availability today. However pt was advised that if she could not make it to her job in [place] that our office would write her a note excusing her. Pt was also advised that if she began having anxiety/panic attacks that she should be [facility] as they have ACT team and she could be started on medications ASAP.   Pt stated an understanding and advised she was going to try to work and go home but would go to the ER if she had recurrent symptoms.

## 2016-09-24 NOTE — Telephone Encounter (Signed)
Patient called to cancel her appointment for tomorrow.    I told her that we would be happy to see her, but she stated that she was much better than the previous call she made to us, and that she would be okay to wait to come in for her appointment next week.  I verified with patient that she was feeling okay, and she stated that she was fine and she would see us next week for her appointment.  PCP notified.

## 2016-09-24 NOTE — Telephone Encounter (Signed)
Agree w/ advice given.  Will see pt tomorrow 

## 2016-09-25 ENCOUNTER — Other Ambulatory Visit: Payer: Self-pay

## 2016-09-25 ENCOUNTER — Ambulatory Visit: Payer: BLUE CROSS/BLUE SHIELD | Admitting: Family Medicine

## 2016-09-25 ENCOUNTER — Encounter (HOSPITAL_COMMUNITY)
Admission: RE | Admit: 2016-09-25 | Discharge: 2016-09-25 | Disposition: A | Discharge status: Home / Self Care | Payer: BLUE CROSS/BLUE SHIELD | Source: Ambulatory Visit | Attending: Obstetrics and Gynecology | Admitting: Obstetrics and Gynecology

## 2016-09-25 ENCOUNTER — Encounter (HOSPITAL_COMMUNITY): Payer: Self-pay

## 2016-09-25 DIAGNOSIS — N920 Excessive and frequent menstruation with regular cycle: Secondary | ICD-10-CM | POA: Insufficient documentation

## 2016-09-25 DIAGNOSIS — Z01812 Encounter for preprocedural laboratory examination: Secondary | ICD-10-CM | POA: Insufficient documentation

## 2016-09-25 HISTORY — DX: Palpitations: R00.2

## 2016-09-25 HISTORY — DX: Depression, unspecified: F32.A

## 2016-09-25 HISTORY — DX: Other complications of anesthesia, initial encounter: T88.59XA

## 2016-09-25 HISTORY — DX: Major depressive disorder, single episode, unspecified: F32.9

## 2016-09-25 HISTORY — DX: Adverse effect of unspecified anesthetic, initial encounter: T41.45XA

## 2016-09-25 HISTORY — DX: Anxiety disorder, unspecified: F41.9

## 2016-09-25 HISTORY — DX: Bronchitis, not specified as acute or chronic: J40

## 2016-09-25 LAB — BASIC METABOLIC PANEL
Anion gap: 8 (ref 5–15)
BUN: 13 mg/dL (ref 6–20)
CHLORIDE: 107 mmol/L (ref 101–111)
CO2: 23 mmol/L (ref 22–32)
Calcium: 9 mg/dL (ref 8.9–10.3)
Creatinine, Ser: 0.57 mg/dL (ref 0.44–1.00)
GFR calc non Af Amer: >60 mL/min (ref >60)
Glucose, Bld: 92 mg/dL (ref 65–99)
POTASSIUM: 3.6 mmol/L (ref 3.5–5.1)
SODIUM: 138 mmol/L (ref 135–145)

## 2016-09-25 LAB — CBC
HEMATOCRIT: 31 % — AB (ref 36.0–46.0)
Hemoglobin: 9.6 g/dL — ABNORMAL LOW (ref 12.0–15.0)
MCH: 24.6 pg — ABNORMAL LOW (ref 26.0–34.0)
MCHC: 31 g/dL (ref 30.0–36.0)
MCV: 79.3 fL (ref 78.0–100.0)
Platelets: 181 10*3/uL (ref 150–400)
RBC: 3.91 MIL/uL (ref 3.87–5.11)
RDW: 16.7 % — ABNORMAL HIGH (ref 11.5–15.5)
WBC: 7.6 10*3/uL (ref 4.0–10.5)

## 2016-09-25 NOTE — Pre-Procedure Instructions (Signed)
DR. Sandford Craze AWARE OF PATIENT'S HISTORY OF PORPHYRIA AND HER CONCERNS ABOUT THE TYPE OF ANESTHESIA THAT CAN BE USED FOR HER ANESTHESIA

## 2016-09-25 NOTE — Pre-Procedure Instructions (Signed)
PATIENT WOULD LIKE FOR ANESTHESIA TO LOOK AT THE [employer] TO SEE WHAT TYPES OF ANESTHESIA IS SAFE TO USE FOR HER ON DAY OF SURGERY.

## 2016-09-25 NOTE — Patient Instructions (Addendum)
Your procedure is scheduled on: Friday October 02, 2016 at 7:30 am  Enter through the [employer] of [facility] at: 6:00 am  Pick up the phone at the desk and dial [phone].  Call this number if you have problems the morning of surgery: [phone].  Remember: Do NOT eat food or drink any liquids after: Midnight on Thursday August 23  Take these medicines the morning of surgery with a SIP OF WATER: Zyrtec, XANAX IF NEEDED  STOP ALL VITAMINS AND SUPPLEMENTS TODAY  Do NOT wear jewelry (body piercing), metal hair clips/bobby pins, make-up, or nail polish. Do NOT wear lotions, powders, or perfumes.  You may wear deoderant. Do NOT shave for 48 hours prior to surgery. Do NOT bring valuables to the hospital. Contacts, dentures, or bridgework may not be worn into surgery.  Have a responsible adult drive you home and stay with you for 24 hours after your procedure

## 2016-10-01 ENCOUNTER — Encounter (HOSPITAL_COMMUNITY): Payer: Self-pay | Admitting: Anesthesiology

## 2016-10-01 ENCOUNTER — Ambulatory Visit (INDEPENDENT_AMBULATORY_CARE_PROVIDER_SITE_OTHER): Payer: BLUE CROSS/BLUE SHIELD | Admitting: Family Medicine

## 2016-10-01 ENCOUNTER — Encounter: Payer: Self-pay | Admitting: Family Medicine

## 2016-10-01 VITALS — BP 126/88 | HR 80 | Resp 16 | Ht 64.0 in | Wt 160.5 lb

## 2016-10-01 DIAGNOSIS — F4323 Adjustment disorder with mixed anxiety and depressed mood: Secondary | ICD-10-CM | POA: Diagnosis not present

## 2016-10-01 DIAGNOSIS — Z0000 Encounter for general adult medical examination without abnormal findings: Secondary | ICD-10-CM

## 2016-10-01 DIAGNOSIS — E785 Hyperlipidemia, unspecified: Secondary | ICD-10-CM | POA: Diagnosis not present

## 2016-10-01 LAB — HEPATIC FUNCTION PANEL
ALBUMIN: 3.8 g/dL (ref 3.5–5.2)
ALT: 23 U/L (ref 0–35)
AST: 21 U/L (ref 0–37)
Alkaline Phosphatase: 66 U/L (ref 39–117)
Bilirubin, Direct: 0.1 mg/dL (ref 0.0–0.3)
Total Bilirubin: 0.4 mg/dL (ref 0.2–1.2)
Total Protein: 6.4 g/dL (ref 6.0–8.3)

## 2016-10-01 LAB — CBC WITH DIFFERENTIAL/PLATELET
Basophils Absolute: 0.1 10*3/uL (ref 0.0–0.1)
Basophils Relative: 1.6 % (ref 0.0–3.0)
EOS PCT: 2.2 % (ref 0.0–5.0)
Eosinophils Absolute: 0.1 10*3/uL (ref 0.0–0.7)
HCT: 31.9 % — ABNORMAL LOW (ref 36.0–46.0)
HEMOGLOBIN: 9.8 g/dL — AB (ref 12.0–15.0)
LYMPHS PCT: 30.2 % (ref 12.0–46.0)
Lymphs Abs: 1.9 10*3/uL (ref 0.7–4.0)
MCHC: 30.7 g/dL (ref 30.0–36.0)
MCV: 79.4 fl (ref 78.0–100.0)
MONOS PCT: 12 % (ref 3.0–12.0)
Monocytes Absolute: 0.8 10*3/uL (ref 0.1–1.0)
Neutro Abs: 3.4 10*3/uL (ref 1.4–7.7)
Neutrophils Relative %: 54 % (ref 43.0–77.0)
Platelets: 159 10*3/uL (ref 150.0–400.0)
RBC: 4.02 Mil/uL (ref 3.87–5.11)
RDW: 17.4 % — ABNORMAL HIGH (ref 11.5–15.5)
WBC: 6.4 10*3/uL (ref 4.0–10.5)

## 2016-10-01 LAB — BASIC METABOLIC PANEL
BUN: 14 mg/dL (ref 6–23)
CALCIUM: 8.8 mg/dL (ref 8.4–10.5)
CO2: 21 mEq/L (ref 19–32)
CREATININE: 0.59 mg/dL (ref 0.40–1.20)
Chloride: 111 mEq/L (ref 96–112)
GFR: 118.04 mL/min (ref >60.00)
Glucose, Bld: 95 mg/dL (ref 70–99)
Potassium: 4.3 mEq/L (ref 3.5–5.1)
Sodium: 137 mEq/L (ref 135–145)

## 2016-10-01 LAB — LIPID PANEL
CHOL/HDL RATIO: 4
Cholesterol: 175 mg/dL (ref 0–200)
HDL: 49.1 mg/dL (ref >39.00)
LDL Cholesterol: 117 mg/dL — ABNORMAL HIGH (ref 0–99)
NONHDL: 125.72
TRIGLYCERIDES: 42 mg/dL (ref 0.0–149.0)
VLDL: 8.4 mg/dL (ref 0.0–40.0)

## 2016-10-01 LAB — TSH: TSH: 1.21 u[IU]/mL (ref 0.35–4.50)

## 2016-10-01 MED ORDER — SERTRALINE HCL 50 MG PO TABS: 50.0000 mg | ORAL_TABLET | Freq: Every day | ORAL | 3 refills | Status: DC

## 2016-10-01 MED ORDER — ALPRAZOLAM 0.25 MG PO TABS: 0.2500 mg | ORAL_TABLET | Freq: Two times a day (BID) | ORAL | 1 refills | Status: DC | PRN

## 2016-10-01 NOTE — Assessment & Plan Note (Signed)
New.  Pt is very stressed with her current family situation.  Refill on Alprazolam provided.  Will start Sertraline.  Reviewed porphyria drug safety database and Sertraline is compatible.  Referred pt for counseling.  Will follow closely.  30 minutes of 45 minute physical spent counseling

## 2016-10-01 NOTE — Patient Instructions (Addendum)
Follow up in 3-4 weeks to recheck mood We'll notify you of your lab results and make any changes if needed Start the Sertraline for anxiety/depression- start w/ 1/2 tab x6 days and then increase to 1 tab daily We'll call you to set up counseling with one of our excellent therapists You are up to date on pap/mammo- yay! Call with any questions or concerns Hang in there!  You can do this!

## 2016-10-01 NOTE — Assessment & Plan Note (Signed)
Pt's PE WNL.  UTD on pap, mammo.  Will hold off on flu shot as pt is allergic to 'mycins' and flu shot was prepared in [place].  Check labs.  Anticipatory guidance provided.

## 2016-10-01 NOTE — Anesthesia Preprocedure Evaluation (Addendum)
Anesthesia Evaluation  Patient identified by MRN, date of birth, ID band Patient awake    Reviewed: Allergy & Precautions, NPO status , Patient's Chart, lab work & pertinent test results  History of Anesthesia Complications (+) history of anesthetic complications  Airway Mallampati: II       Dental  (+) Caps, Missing   Pulmonary Current Smoker,    Pulmonary exam normal breath sounds clear to auscultation       Cardiovascular hypertension, Normal cardiovascular exam Rhythm:Regular Rate:Normal     Neuro/Psych  Headaches, PSYCHIATRIC DISORDERS Anxiety Depression negative neurological ROS     GI/Hepatic negative GI ROS, Neg liver ROS,   Endo/Other  negative endocrine ROS  Renal/GU negative Renal ROS  negative genitourinary   Musculoskeletal negative musculoskeletal ROS (+)   Abdominal   Peds  Hematology  (+) anemia , Variagate porphyria   Anesthesia Other Findings   Reproductive/Obstetrics Menorrhagia                           Anesthesia Physical Anesthesia Plan  ASA: II  Anesthesia Plan: MAC   Post-op Pain Management:    Induction: Intravenous  PONV Risk Score and Plan: 3 and Ondansetron, Dexamethasone, Midazolam and Propofol infusion  Airway Management Planned: Natural Airway, Nasal Cannula and Simple Face Mask  Additional Equipment:   Intra-op Plan:   Post-operative Plan:   Informed Consent: I have reviewed the patients History and Physical, chart, labs and discussed the procedure including the risks, benefits and alternatives for the proposed anesthesia with the patient or authorized representative who has indicated his/her understanding and acceptance.   Dental advisory given  Plan Discussed with: CRNA, Anesthesiologist and Surgeon  Anesthesia Plan Comments:       Anesthesia Quick Evaluation

## 2016-10-01 NOTE — Progress Notes (Signed)
Pre visit review using our clinic review tool, if applicable. No additional management support is needed unless otherwise documented below in the visit note. 

## 2016-10-01 NOTE — Progress Notes (Signed)
   Subjective:    Patient ID: Wendy Evans, female    DOB: 1975-11-25, 41 y.o.   MRN: [id number]  HPI CPE- UTD on pap, mammo.  Unable to take flu shot due to 'mycin' allergy.     Review of Systems Patient reports no vision/ hearing changes, adenopathy,fever, weight change,  persistant/recurrent hoarseness , swallowing issues, chest pain, palpitations, edema, persistant/recurrent cough, hemoptysis, dyspnea (rest/exertional/paroxysmal nocturnal), gastrointestinal bleeding (melena, rectal bleeding), abdominal pain, significant heartburn, bowel changes, GU symptoms (dysuria, hematuria, incontinence),  syncope, focal weakness, memory loss, numbness & tingling, skin/hair/nail changes, abnormal bruising or bleeding.  + depression- pt scores 12 on depression screen b/c she has recently taken custody of her 2 nieces- one with significant delays and 1 with mental health issues  + heavy menstrual bleeding- has ablation scheduled    Objective:   Physical Exam General Appearance:    Alert, cooperative, no distress, appears stated age  Head:    Normocephalic, without obvious abnormality, atraumatic  Eyes:    PERRL, conjunctiva/corneas clear, EOM's intact, fundi    benign, both eyes  Ears:    Normal TM's and external ear canals, both ears  Nose:   Nares normal, septum midline, mucosa normal, no drainage    or sinus tenderness  Throat:   Lips, mucosa, and tongue normal; teeth and gums normal  Neck:   Supple, symmetrical, trachea midline, no adenopathy;    Thyroid: no enlargement/tenderness/nodules  Back:     Symmetric, no curvature, ROM normal, no CVA tenderness  Lungs:     Clear to auscultation bilaterally, respirations unlabored  Chest Wall:    No tenderness or deformity   Heart:    Regular rate and rhythm, S1 and S2 normal, no murmur, rub   or gallop  Breast Exam:    Deferred to GYN  Abdomen:     Soft, non-tender, bowel sounds active all four quadrants,    no masses, no organomegaly  Genitalia:     Deferred to GYN  Rectal:    Extremities:   Extremities normal, atraumatic, no cyanosis or edema  Pulses:   2+ and symmetric all extremities  Skin:   Skin color, texture, turgor normal, no rashes or lesions  Lymph nodes:   Cervical, supraclavicular, and axillary nodes normal  Neurologic:   CNII-XII intact, normal strength, sensation and reflexes    throughout          Assessment & Plan:

## 2016-10-02 ENCOUNTER — Encounter (HOSPITAL_COMMUNITY): Payer: Self-pay | Admitting: *Deleted

## 2016-10-02 ENCOUNTER — Encounter (HOSPITAL_COMMUNITY)
Admission: RE | Disposition: A | Discharge status: Home / Self Care | Payer: Self-pay | Source: Ambulatory Visit | Attending: Obstetrics and Gynecology

## 2016-10-02 ENCOUNTER — Ambulatory Visit (HOSPITAL_COMMUNITY): Payer: BLUE CROSS/BLUE SHIELD | Admitting: Anesthesiology

## 2016-10-02 ENCOUNTER — Ambulatory Visit (HOSPITAL_COMMUNITY)
Admission: RE | Admit: 2016-10-02 | Discharge: 2016-10-02 | Disposition: A | Discharge status: Home / Self Care | Payer: BLUE CROSS/BLUE SHIELD | Source: Ambulatory Visit | Attending: Obstetrics and Gynecology | Admitting: Obstetrics and Gynecology

## 2016-10-02 ENCOUNTER — Encounter: Payer: Self-pay | Admitting: General Practice

## 2016-10-02 DIAGNOSIS — F329 Major depressive disorder, single episode, unspecified: Secondary | ICD-10-CM | POA: Diagnosis not present

## 2016-10-02 DIAGNOSIS — N920 Excessive and frequent menstruation with regular cycle: Secondary | ICD-10-CM | POA: Insufficient documentation

## 2016-10-02 DIAGNOSIS — I10 Essential (primary) hypertension: Secondary | ICD-10-CM | POA: Diagnosis not present

## 2016-10-02 DIAGNOSIS — F17200 Nicotine dependence, unspecified, uncomplicated: Secondary | ICD-10-CM | POA: Diagnosis not present

## 2016-10-02 DIAGNOSIS — Z886 Allergy status to analgesic agent status: Secondary | ICD-10-CM | POA: Insufficient documentation

## 2016-10-02 DIAGNOSIS — Z888 Allergy status to other drugs, medicaments and biological substances status: Secondary | ICD-10-CM | POA: Diagnosis not present

## 2016-10-02 DIAGNOSIS — Z881 Allergy status to other antibiotic agents status: Secondary | ICD-10-CM | POA: Diagnosis not present

## 2016-10-02 DIAGNOSIS — Z91040 Latex allergy status: Secondary | ICD-10-CM | POA: Insufficient documentation

## 2016-10-02 DIAGNOSIS — Z882 Allergy status to sulfonamides status: Secondary | ICD-10-CM | POA: Diagnosis not present

## 2016-10-02 DIAGNOSIS — Z880 Allergy status to penicillin: Secondary | ICD-10-CM | POA: Insufficient documentation

## 2016-10-02 DIAGNOSIS — Z79899 Other long term (current) drug therapy: Secondary | ICD-10-CM | POA: Insufficient documentation

## 2016-10-02 DIAGNOSIS — F419 Anxiety disorder, unspecified: Secondary | ICD-10-CM | POA: Diagnosis not present

## 2016-10-02 DIAGNOSIS — Z885 Allergy status to narcotic agent status: Secondary | ICD-10-CM | POA: Diagnosis not present

## 2016-10-02 HISTORY — DX: Headache, unspecified: R51.9

## 2016-10-02 HISTORY — PX: DILITATION & CURRETTAGE/HYSTROSCOPY WITH HYDROTHERMAL ABLATION: SHX5570

## 2016-10-02 HISTORY — DX: Headache: R51

## 2016-10-02 SURGERY — DILATATION & CURETTAGE/HYSTEROSCOPY WITH HYDROTHERMAL ABLATION
Anesthesia: General

## 2016-10-02 MED ORDER — SCOPOLAMINE 1 MG/3DAYS TD PT72
MEDICATED_PATCH | TRANSDERMAL | Status: AC
  Administered 2016-10-02: 1.5 mg via TRANSDERMAL
  Filled 2016-10-02: qty 1

## 2016-10-02 MED ORDER — SCOPOLAMINE 1 MG/3DAYS TD PT72
1.0000 | MEDICATED_PATCH | Freq: Once | TRANSDERMAL | Status: DC
  Administered 2016-10-02: 1.5 mg via TRANSDERMAL

## 2016-10-02 MED ORDER — BUPIVACAINE HCL (PF) 0.5 % IJ SOLN
INTRAMUSCULAR | Status: AC
  Filled 2016-10-02: qty 30

## 2016-10-02 MED ORDER — FENTANYL CITRATE (PF) 250 MCG/5ML IJ SOLN
INTRAMUSCULAR | Status: AC
  Filled 2016-10-02: qty 5

## 2016-10-02 MED ORDER — PROPOFOL 500 MG/50ML IV EMUL
INTRAVENOUS | Status: DC | PRN
  Administered 2016-10-02: 75 ug/kg/min via INTRAVENOUS

## 2016-10-02 MED ORDER — SODIUM CHLORIDE 0.9 % IR SOLN
Status: DC | PRN
  Administered 2016-10-02: 3000 mL

## 2016-10-02 MED ORDER — CIPROFLOXACIN IN D5W 400 MG/200ML IV SOLN
400.0000 mg | INTRAVENOUS | Status: AC
  Administered 2016-10-02: 400 mg via INTRAVENOUS
  Filled 2016-10-02: qty 200

## 2016-10-02 MED ORDER — BUPIVACAINE HCL (PF) 0.5 % IJ SOLN
INTRAMUSCULAR | Status: DC | PRN
  Administered 2016-10-02: 10 mL

## 2016-10-02 MED ORDER — METOCLOPRAMIDE HCL 5 MG/ML IJ SOLN
INTRAMUSCULAR | Status: AC
  Filled 2016-10-02: qty 2

## 2016-10-02 MED ORDER — MIDAZOLAM HCL 2 MG/2ML IJ SOLN
INTRAMUSCULAR | Status: DC | PRN
  Administered 2016-10-02 (×2): 1 mg via INTRAVENOUS

## 2016-10-02 MED ORDER — ONDANSETRON HCL 4 MG/2ML IJ SOLN
INTRAMUSCULAR | Status: AC
  Filled 2016-10-02: qty 2

## 2016-10-02 MED ORDER — FENTANYL CITRATE (PF) 100 MCG/2ML IJ SOLN: 25.0000 ug | INTRAMUSCULAR | Status: DC | PRN

## 2016-10-02 MED ORDER — ONDANSETRON HCL 4 MG/2ML IJ SOLN
INTRAMUSCULAR | Status: DC | PRN
Start: 2016-10-02 — End: 2016-10-02
  Administered 2016-10-02: 4 mg via INTRAVENOUS

## 2016-10-02 MED ORDER — PROPOFOL 10 MG/ML IV BOLUS
INTRAVENOUS | Status: DC | PRN
  Administered 2016-10-02 (×2): 20 mg via INTRAVENOUS

## 2016-10-02 MED ORDER — LACTATED RINGERS IV SOLN
INTRAVENOUS | Status: DC
  Administered 2016-10-02 (×2): via INTRAVENOUS

## 2016-10-02 MED ORDER — DEXAMETHASONE SODIUM PHOSPHATE 10 MG/ML IJ SOLN
INTRAMUSCULAR | Status: AC
  Filled 2016-10-02: qty 1

## 2016-10-02 MED ORDER — METOCLOPRAMIDE HCL 5 MG/ML IJ SOLN
10.0000 mg | Freq: Once | INTRAMUSCULAR | Status: AC | PRN
  Administered 2016-10-02: 10 mg via INTRAVENOUS

## 2016-10-02 MED ORDER — LIDOCAINE HCL (CARDIAC) 20 MG/ML IV SOLN
INTRAVENOUS | Status: AC
  Filled 2016-10-02: qty 5

## 2016-10-02 MED ORDER — FENTANYL CITRATE (PF) 100 MCG/2ML IJ SOLN
INTRAMUSCULAR | Status: DC | PRN
  Administered 2016-10-02 (×4): 50 ug via INTRAVENOUS

## 2016-10-02 MED ORDER — PROPOFOL 10 MG/ML IV BOLUS
INTRAVENOUS | Status: AC
  Filled 2016-10-02: qty 20

## 2016-10-02 MED ORDER — METRONIDAZOLE IN NACL 5-0.79 MG/ML-% IV SOLN
500.0000 mg | INTRAVENOUS | Status: AC
  Administered 2016-10-02: 500 mg via INTRAVENOUS
  Filled 2016-10-02: qty 100

## 2016-10-02 MED ORDER — LIDOCAINE HCL (CARDIAC) 20 MG/ML IV SOLN
INTRAVENOUS | Status: DC | PRN
  Administered 2016-10-02: 50 mg via INTRAVENOUS

## 2016-10-02 MED ORDER — LIDOCAINE HCL 1 % IJ SOLN
INTRAMUSCULAR | Status: AC
  Filled 2016-10-02: qty 20

## 2016-10-02 MED ORDER — LACTATED RINGERS IV SOLN
INTRAVENOUS | Status: DC
Start: 2016-10-02 — End: 2016-10-02
  Administered 2016-10-02: 07:00:00 via INTRAVENOUS

## 2016-10-02 MED ORDER — MIDAZOLAM HCL 2 MG/2ML IJ SOLN
INTRAMUSCULAR | Status: AC
  Filled 2016-10-02: qty 2

## 2016-10-02 SURGICAL SUPPLY — 14 items
CANISTER SUCT 3000ML PPV (MISCELLANEOUS) ×2 IMPLANT
CATH ROBINSON RED A/P 16FR (CATHETERS) ×2 IMPLANT
CLOTH BEACON ORANGE TIMEOUT ST (SAFETY) ×2 IMPLANT
CONTAINER PREFILL 10% NBF 60ML (FORM) ×4 IMPLANT
ELECT REM PT RETURN 9FT ADLT (ELECTROSURGICAL) ×2
ELECTRODE REM PT RTRN 9FT ADLT (ELECTROSURGICAL) ×1 IMPLANT
GLOVE BIO SURGEON STRL SZ 6.5 (GLOVE) ×2 IMPLANT
GLOVE BIOGEL PI IND STRL 7.0 (GLOVE) ×1 IMPLANT
GLOVE BIOGEL PI INDICATOR 7.0 (GLOVE) ×1
GOWN STRL REUS W/TWL LRG LVL3 (GOWN DISPOSABLE) ×4 IMPLANT
PACK VAGINAL MINOR WOMEN LF (CUSTOM PROCEDURE TRAY) ×2 IMPLANT
PAD OB MATERNITY 4.3X12.25 (PERSONAL CARE ITEMS) ×2 IMPLANT
SET GENESYS HTA PROCERVA (MISCELLANEOUS) ×2 IMPLANT
TOWEL OR 17X24 6PK STRL BLUE (TOWEL DISPOSABLE) ×4 IMPLANT

## 2016-10-02 NOTE — OR Nursing (Signed)
Pt c/o nausea before d/c . Medication given as prescribed by MD.  Pt stated feeling "better before d/c home. Encourage pt to call provider if any worsening symptoms continue after discharged. Pt and mother verbalized understanding of all d/c instructions.

## 2016-10-02 NOTE — Brief Op Note (Signed)
10/02/2016  8:08 AM  PATIENT:  Wendy Evans  42 y.o. female  PRE-OPERATIVE DIAGNOSIS:   Menorrhagia Variegate Porphyria  POST-OPERATIVE DIAGNOSIS:  Same  PROCEDURE:  Procedure(s) with comments: DILATATION & CURETTAGE/HYSTEROSCOPY WITH HYDROTHERMAL ABLATION (N/A) - PT HAS HISTORY OF VARIEGATE PORPHYRIA HTA rep will be here confirmed on 09/07/16  SURGEON:  Surgeon(s) and Role:    * Dian Queen, MD - Primary  PHYSICIAN ASSISTANT:   ASSISTANTS: none   ANESTHESIA:   paracervical block and MAC  EBL:  Total I/O In: -  Out: 2 [Blood:2]  BLOOD ADMINISTERED:none  DRAINS: none   LOCAL MEDICATIONS USED:  OTHER bupivacaine   SPECIMEN:  Source of Specimen:  uterine curettings  DISPOSITION OF SPECIMEN:  PATHOLOGY  COUNTS:  YES  TOURNIQUET:  * No tourniquets in log *  DICTATION: .Other Dictation: Dictation Number [id number]  PLAN OF CARE: Discharge to home after PACU  PATIENT DISPOSITION:  PACU - hemodynamically stable.   Delay start of Pharmacological VTE agent (>24hrs) due to surgical blood loss or risk of bleeding: not applicable

## 2016-10-02 NOTE — Transfer of Care (Signed)
Immediate Anesthesia Transfer of Care Note  Patient: Wendy Evans  Procedure(s) Performed: Procedure(s) with comments: DILATATION & CURETTAGE/HYSTEROSCOPY WITH HYDROTHERMAL ABLATION (N/A) - PT HAS HISTORY OF VARIEGATE PORPHYRIA HTA rep will be here confirmed on 09/07/16  Patient Location: PACU  Anesthesia Type:MAC  Level of Consciousness: awake, alert  and patient cooperative  Airway & Oxygen Therapy: Patient Spontanous Breathing  Post-op Assessment: Report given to RN and Post -op Vital signs reviewed and stable  Post vital signs: Reviewed and stable  Last Vitals:  Vitals:   10/02/16 0623  BP: (!) 163/98  Pulse: 88  Resp: 16  Temp: 36.8 C  SpO2: 100%    Last Pain:  Vitals:   10/02/16 0623  TempSrc: Oral      Patients Stated Pain Goal: 4 (38/93/73 4287)  Complications: No apparent anesthesia complications

## 2016-10-02 NOTE — Progress Notes (Signed)
H and p on the chart No significant changes Will proceed with HSC, D and C, HTA Consent signed

## 2016-10-02 NOTE — Anesthesia Postprocedure Evaluation (Signed)
Anesthesia Post Note  Patient: Wendy Evans  Procedure(s) Performed: Procedure(s) (LRB): DILATATION & CURETTAGE/HYSTEROSCOPY WITH HYDROTHERMAL ABLATION (N/A)     Patient location during evaluation: PACU Anesthesia Type: General Level of consciousness: awake and alert and oriented Pain management: pain level controlled Vital Signs Assessment: post-procedure vital signs reviewed and stable Respiratory status: spontaneous breathing, nonlabored ventilation and respiratory function stable Cardiovascular status: blood pressure returned to baseline and stable Postop Assessment: no signs of nausea or vomiting Anesthetic complications: no    Last Vitals:  Vitals:   10/02/16 0830 10/02/16 0845  BP:  139/72  Pulse: 73 (!) 58  Resp: 13 14  Temp:    SpO2: 98% 99%    Last Pain:  Vitals:   10/02/16 0845  TempSrc:   PainSc: Asleep   Pain Goal: Patients Stated Pain Goal: 4 (10/02/16 6222)               Madalene Mickler A.

## 2016-10-02 NOTE — Discharge Instructions (Signed)
°  Post Anesthesia Home Care Instructions ° °Activity: °Get plenty of rest for the remainder of the day. A responsible individual must stay with you for 24 hours following the procedure.  °For the next 24 hours, DO NOT: °-Drive a car °-Operate machinery °-Drink alcoholic beverages °-Take any medication unless instructed by your physician °-Make any legal decisions or sign important papers. ° °Meals: °Start with liquid foods such as gelatin or soup. Progress to regular foods as tolerated. Avoid greasy, spicy, heavy foods. If nausea and/or vomiting occur, drink only clear liquids until the nausea and/or vomiting subsides. Call your physician if vomiting continues. ° °Special Instructions/Symptoms: °Your throat may feel dry or sore from the anesthesia or the breathing tube placed in your throat during surgery. If this causes discomfort, gargle with warm salt water. The discomfort should disappear within 24 hours. ° °If you had a scopolamine patch placed behind your ear for the management of post- operative nausea and/or vomiting: ° °1. The medication in the patch is effective for 72 hours, after which it should be removed.  Wrap patch in a tissue and discard in the trash. Wash hands thoroughly with soap and water. °2. You may remove the patch earlier than 72 hours if you experience unpleasant side effects which may include dry mouth, dizziness or visual disturbances. °3. Avoid touching the patch. Wash your hands with soap and water after contact with the patch. °  °DISCHARGE INSTRUCTIONS: D&C / D&E °The following instructions have been prepared to help you care for yourself upon your return home. °  °Personal hygiene: °• Use sanitary pads for vaginal drainage, not tampons. °• Shower the day after your procedure. °• NO tub baths, pools or Jacuzzis for 2-3 weeks. °• Wipe front to back after using the bathroom. ° °Activity and limitations: °• Do NOT drive or operate any equipment for 24 hours. The effects of anesthesia are  still present and drowsiness may result. °• Do NOT rest in bed all day. °• Walking is encouraged. °• Walk up and down stairs slowly. °• You may resume your normal activity in one to two days or as indicated by your physician. ° °Sexual activity: NO intercourse for at least 2 weeks after the procedure, or as indicated by your physician. ° °Diet: Eat a light meal as desired this evening. You may resume your usual diet tomorrow. ° °Return to work: You may resume your work activities in one to two days or as indicated by your doctor. ° °What to expect after your surgery: Expect to have vaginal bleeding/discharge for 2-3 days and spotting for up to 10 days. It is not unusual to have soreness for up to 1-2 weeks. You may have a slight burning sensation when you urinate for the first day. Mild cramps may continue for a couple of days. You may have a regular period in 2-6 weeks. ° °Call your doctor for any of the following: °• Excessive vaginal bleeding, saturating and changing one pad every hour. °• Inability to urinate 6 hours after discharge from hospital. °• Pain not relieved by pain medication. °• Fever of 100.4° F or greater. °• Unusual vaginal discharge or odor. ° ° Call for an appointment:  ° ° °Patient’s signature: ______________________ ° °Nurse’s signature ________________________ ° °Support person's signature_______________________ ° ° ° °

## 2016-10-03 ENCOUNTER — Encounter (HOSPITAL_COMMUNITY): Payer: Self-pay | Admitting: Obstetrics and Gynecology

## 2016-10-03 NOTE — Op Note (Signed)
NAMECHRISTON, GALLAWAY               ACCOUNT NO.:  [id number]  MEDICAL RECORD NO.:  [id number]  LOCATION:                                 FACILITY:  PHYSICIAN:  Benoit Helane Rima, M.D.    DATE OF BIRTH:  DATE OF PROCEDURE:  10/02/2016 DATE OF DISCHARGE:                              OPERATIVE REPORT   PREOPERATIVE DIAGNOSES:  Menorrhagia and variegate porphyria.  POSTOPERATIVE DIAGNOSES:  Menorrhagia and variegate porphyria.  PROCEDURES:  Hysteroscopy, dilatation and curettage, and hydrothermal ablation.  SURGEON:  Kervin Bones L. Helane Rima, M.D..  ANESTHESIA:  MAC with paracervical block.  ESTIMATED BLOOD LOSS:  Minimal.  COMPLICATIONS:  None.  PROCEDURE IN DETAIL:  The patient was taken to the operating room.  She was administered anesthesia.  She was then prepped and draped in usual sterile fashion.  Time-out was performed.  A speculum was placed in the vagina.  The cervix was grasped with tenaculum and a paracervical block was performed.  The cervical internal os was gently dilated using Pratt dilators.  A sharp curette were inserted and the uterus was thoroughly curetted.  All tissue was sent to pathology.  We then inserted the __________ scope was introduced and correct placement was confirmed. Fluid check was 0 mL __________ was performed for 10 minutes with no complication.  At the end of the procedure, the endometrium appeared to be ablated well.  The intact instrument was removed.  All instruments were removed from the vagina.  The patient tolerated the procedure well, went to recovery room in stable condition.     Kym Fenter L. Helane Rima, M.D.     Nevin Bloodgood  D:  10/02/2016  T:  10/02/2016  Job:  [id number]

## 2016-10-22 DIAGNOSIS — H1045 Other chronic allergic conjunctivitis: Secondary | ICD-10-CM | POA: Diagnosis not present

## 2016-10-28 ENCOUNTER — Encounter: Payer: Self-pay | Admitting: Family Medicine

## 2016-10-28 ENCOUNTER — Ambulatory Visit (INDEPENDENT_AMBULATORY_CARE_PROVIDER_SITE_OTHER): Payer: BLUE CROSS/BLUE SHIELD | Admitting: Family Medicine

## 2016-10-28 ENCOUNTER — Other Ambulatory Visit (HOSPITAL_COMMUNITY)
Admission: RE | Admit: 2016-10-28 | Discharge: 2016-10-28 | Disposition: A | Discharge status: Home / Self Care | Payer: BLUE CROSS/BLUE SHIELD | Source: Ambulatory Visit | Attending: Family Medicine | Admitting: Family Medicine

## 2016-10-28 VITALS — BP 124/80 | HR 96 | Temp 98.1°F | Resp 16 | Ht 64.0 in | Wt 159.4 lb

## 2016-10-28 DIAGNOSIS — M79641 Pain in right hand: Secondary | ICD-10-CM

## 2016-10-28 DIAGNOSIS — N898 Other specified noninflammatory disorders of vagina: Secondary | ICD-10-CM | POA: Diagnosis not present

## 2016-10-28 DIAGNOSIS — F4323 Adjustment disorder with mixed anxiety and depressed mood: Secondary | ICD-10-CM

## 2016-10-28 NOTE — Progress Notes (Signed)
   Subjective:    Patient ID: Wendy Evans, female    DOB: August 04, 1975, 41 y.o.   MRN: [id number]  HPI Anxiety/depression- pt was started on Sertraline last visit but 'forgets to take it quite frequently'  Is only taking it about 4x/weeks.  Pt reports she is 'getting used to it' and is not sure if this is the medication helping or things just getting better.  Hand injury- pt fell, striking lateral edge of hand against the wall.  'i think I have a boxer break'.  Fall occurred 2 days ago (Monday night).  Pt has a bump over the 5th metatarsal that is painful and pinky 'doesn't line up anymore' (slight lateral deviation)  Vaginal discharge- no odor, mild itching.  Brown in color.  Pt has been on multiple abx and had recent ablation.   Review of Systems For ROS see HPI     Objective:   Physical Exam  Constitutional: She is oriented to person, place, and time. She appears well-developed and well-nourished. No distress.  Musculoskeletal: She exhibits edema (swelling and TTP over R 5th metacarpal w/ mild bony deformity) and tenderness.  Neurological: She is alert and oriented to person, place, and time.  Skin: Skin is warm and dry.  Psychiatric: She has a normal mood and affect. Her behavior is normal. Thought content normal.  Vitals reviewed.         Assessment & Plan:  R hand pain- possible 5th metacarpal fx after striking her hand against the wall.  Xray ordered along w/ referral to Sports Med.  Vaginal d/c- new.  Pt reports she has had this since completing abx and having her ablation.  Will do urine cytology for possible yeast or BV but if none present and d/c continues, will need to go back to GYN.  Pt expressed understanding and is in agreement w/ plan.

## 2016-10-28 NOTE — Assessment & Plan Note (Signed)
Mild improvement since starting medication but pt is not taking this regularly as she should.  Stressed the need for regular medication usage to improve her sxs.  Will continue to monitor.

## 2016-10-28 NOTE — Patient Instructions (Signed)
Follow up in 2-3 months to recheck mood Go to the [facility] [place] [facility] office and get your xray We'll call you with your Sports Med Referral We'll notify you of your urine results and treat if needed Continue the Sertraline once daily Call with any questions or concerns Hang in there!!!

## 2016-10-28 NOTE — Progress Notes (Signed)
Pre visit review using our clinic review tool, if applicable. No additional management support is needed unless otherwise documented below in the visit note. 

## 2016-10-30 ENCOUNTER — Ambulatory Visit (INDEPENDENT_AMBULATORY_CARE_PROVIDER_SITE_OTHER): Payer: BLUE CROSS/BLUE SHIELD

## 2016-10-30 DIAGNOSIS — M79641 Pain in right hand: Secondary | ICD-10-CM

## 2016-10-30 DIAGNOSIS — S6991XA Unspecified injury of right wrist, hand and finger(s), initial encounter: Secondary | ICD-10-CM | POA: Diagnosis not present

## 2016-10-30 DIAGNOSIS — Z23 Encounter for immunization: Secondary | ICD-10-CM

## 2016-11-05 LAB — URINE CYTOLOGY ANCILLARY ONLY
Bacterial vaginitis: NEGATIVE
Candida vaginitis: NEGATIVE

## 2016-11-13 ENCOUNTER — Ambulatory Visit: Payer: Self-pay

## 2016-11-13 ENCOUNTER — Encounter: Payer: Self-pay | Admitting: Sports Medicine

## 2016-11-13 ENCOUNTER — Ambulatory Visit (INDEPENDENT_AMBULATORY_CARE_PROVIDER_SITE_OTHER): Payer: BLUE CROSS/BLUE SHIELD

## 2016-11-13 ENCOUNTER — Ambulatory Visit (INDEPENDENT_AMBULATORY_CARE_PROVIDER_SITE_OTHER): Payer: BLUE CROSS/BLUE SHIELD | Admitting: Sports Medicine

## 2016-11-13 VITALS — BP 140/90 | HR 79 | Ht 64.0 in | Wt 158.8 lb

## 2016-11-13 DIAGNOSIS — S62314A Displaced fracture of base of fourth metacarpal bone, right hand, initial encounter for closed fracture: Secondary | ICD-10-CM | POA: Diagnosis not present

## 2016-11-13 DIAGNOSIS — M79641 Pain in right hand: Secondary | ICD-10-CM | POA: Diagnosis not present

## 2016-11-13 DIAGNOSIS — S62308A Unspecified fracture of other metacarpal bone, initial encounter for closed fracture: Secondary | ICD-10-CM | POA: Insufficient documentation

## 2016-11-13 DIAGNOSIS — S6991XA Unspecified injury of right wrist, hand and finger(s), initial encounter: Secondary | ICD-10-CM | POA: Diagnosis not present

## 2016-11-13 MED ORDER — DICLOFENAC SODIUM 2 % TD SOLN: 1.0000 "application " | Freq: Two times a day (BID) | TRANSDERMAL | 0 refills | Status: AC

## 2016-11-13 NOTE — Progress Notes (Signed)
OFFICE VISIT NOTE Wendy Evans. Delorise Shiner [facility] at [facility] [phone]  Wendy Evans - 41 y.o. female MRN [id number]  Date of birth: 1975-04-09  Visit Date: 11/13/2016  PCP: Sheliah Hatch, MD   Referred by: Sheliah Hatch, MD  Fabio Pierce PT, LAT, ATC acting as scribe for Dr. Berline Chough.  SUBJECTIVE:   Chief Complaint  Patient presents with  . New Patient (Initial Visit)    R hand pain   HPI: As below and per problem based documentation when appropriate.  [name] is a new pt. presenting w/ c/o R hand pain.  Pt states that she was trying to step over something at home on 09/17/18when she tripped over it and fell into a door frame hitting her R hand along the door frame.  She notes that she came to [facility] a few weeks ago to get an x-ray and that the x-ray was read as normal.  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  Pt rates her pain at rest as a 2-3/10 and an 8/10 at it's worst.  Pt notes the worst pain w/ pinky to thumb opposition and w/ gripping activities when attempting to open a jar or bottle.  She states that she has been wearing a wrist brace consistently except for when she is interpreting due to needing to move her fingers/hands more freely.  Pt denies any N/T.    Review of Systems  Constitutional: Negative for chills, fever and weight loss.  HENT: Negative.   Eyes: Negative.   Respiratory: Positive for cough. Negative for shortness of breath and wheezing.   Cardiovascular: Positive for palpitations. Negative for chest pain.  Gastrointestinal: Negative for abdominal pain, heartburn and nausea.  Genitourinary: Negative.   Musculoskeletal: Positive for falls and joint pain.  Neurological: Positive for tingling. Negative for dizziness and headaches.  Endo/Heme/Allergies: Does not bruise/bleed easily.  Psychiatric/Behavioral: Positive  for depression. The patient is nervous/anxious and has insomnia.     Otherwise per HPI   HISTORY & PERTINENT PRIOR DATA:  Prior History reviewed and updated per electronic medical record.  Significant history, findings, studies and interim changes include:  reports that she has been smoking cigarettes.  She has a 11.50 pack-year smoking history. she has never used smokeless tobacco. No results for input(s): HGBA1C, LABURIC, CREATINE in the last 8760 hours. No specialty comments available. Problem  Closed Fracture of 4th Metacarpal   Non-displaced      OBJECTIVE:  VS:  HT:5\' 4"  (162.6 cm)   WT:158 lb 12.8 oz (72 kg)  BMI:27.24    BP:140/90  HR:79bpm  TEMP: ( )  RESP:97 %  PHYSICAL EXAM: Constitutional: WDWN, Non-toxic appearing. Psychiatric: Alert & appropriately interactive. Not depressed or anxious appearing. Respiratory: No increased work of breathing. Trachea Midline Eyes: Pupils are equal. EOM intact without nystagmus. No scleral icterus  Right hand: Well aligned she does have a small amount of swelling and discoloration at the fourth metacarpal.  This is mild.  Grip strength is intact although slightly painful  X-rays and ultrasound reviewed  ASSESSMENT & PLAN:   1. Right hand pain   2. Closed displaced fracture of base of fourth metacarpal bone of right hand, initial encounter   3. Variegate porphyria (HCC)    PLAN:    Closed fracture of 4th metacarpal Appreciated only on ultrasound today.  Discussed options for mobilization versus avoidance activities.  She would like to undergo avoidance activities only and will consider buddy taping.   ++++++++++++++++++++++++++++++++++++++++++++ Orders & Meds: Orders Placed This Encounter  Procedures  . DG Hand Complete Right  . [place] LIMITED JOINT SPACE STRUCTURES UP RIGHT(NO LINKED CHARGES)    Meds ordered this encounter  Medications  . Diclofenac Sodium (PENNSAID) 2 % SOLN    Sig: Place 1 application onto the skin 2  (two) times daily.    Dispense:  8 g    Refill:  0    ++++++++++++++++++++++++++++++++++++++++++++ Follow-up: Return in about 3 weeks (around 12/04/2016).   Pertinent documentation may be included in additional procedure notes, imaging studies, problem based documentation and patient instructions. Please see these sections of the encounter for additional information regarding this visit. CMA/ATC served as Neurosurgeon during this visit. History, Physical, and Plan performed by medical provider. Documentation and orders reviewed and attested to.      Wendy Mews, DO    [facility] Phys[facility]

## 2016-12-04 ENCOUNTER — Ambulatory Visit: Payer: BLUE CROSS/BLUE SHIELD | Admitting: Sports Medicine

## 2016-12-14 ENCOUNTER — Ambulatory Visit: Payer: BLUE CROSS/BLUE SHIELD | Admitting: Sports Medicine

## 2016-12-16 ENCOUNTER — Ambulatory Visit (INDEPENDENT_AMBULATORY_CARE_PROVIDER_SITE_OTHER): Payer: BLUE CROSS/BLUE SHIELD

## 2016-12-16 ENCOUNTER — Encounter: Payer: Self-pay | Admitting: Sports Medicine

## 2016-12-16 ENCOUNTER — Ambulatory Visit (INDEPENDENT_AMBULATORY_CARE_PROVIDER_SITE_OTHER): Payer: BLUE CROSS/BLUE SHIELD | Admitting: Sports Medicine

## 2016-12-16 VITALS — BP 132/84 | HR 76 | Ht 64.0 in | Wt 163.0 lb

## 2016-12-16 DIAGNOSIS — M79641 Pain in right hand: Secondary | ICD-10-CM | POA: Diagnosis not present

## 2016-12-16 DIAGNOSIS — S62314A Displaced fracture of base of fourth metacarpal bone, right hand, initial encounter for closed fracture: Secondary | ICD-10-CM | POA: Diagnosis not present

## 2016-12-16 DIAGNOSIS — M65341 Trigger finger, right ring finger: Secondary | ICD-10-CM | POA: Diagnosis not present

## 2016-12-16 MED ORDER — DICLOFENAC SODIUM 2 % TD SOLN
1.0000 | Freq: Two times a day (BID) | TRANSDERMAL | 2 refills | Status: DC
Start: 2016-12-16 — End: 2017-11-11

## 2016-12-16 MED ORDER — DICLOFENAC SODIUM 2 % TD SOLN: 1.0000 "application " | Freq: Two times a day (BID) | TRANSDERMAL | 0 refills | Status: AC

## 2016-12-16 NOTE — Assessment & Plan Note (Signed)
90% improved at this time.  Suspect another several weeks before complete resolution.  she has been able to return to activities as tolerated and has only minimal discomfort with hand squeeze test.  Sample of  Pennsaid provided today and she will call us if she is interested in a prescription for this.

## 2016-12-16 NOTE — Progress Notes (Signed)
OFFICE VISIT NOTE Wendy FellsMichael Evans. Delorise Shinerigby, DO  [place] [facility] [phone]  [name] MRN [id number]  Date of birth: 1975/10/13  Visit Date: 12/16/2016  PCP: Wendy Evans, Wendy E, MD   Referred by: Wendy Evans, Wendy E, MD  Wendy PierceMolly A Evans PT, LAT, ATC acting as scribe for Dr. Berline Choughigby.  SUBJECTIVE:   Chief Complaint  Patient presents with  . Follow-up    R 4th MC fracture   HPI: As below and per problem based documentation when appropriate.  [name] is an established pt presenting today for f/u of her R 4th MC fx.  She was last seen on 11/13/16 at which time an x-ray was performed and she was prescribed Pennsaid.  Pt states that her R hand has gotten better since her last visit, rating it as 90% improved.  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  She notes that she is concerned about a bump that has appeared on the dorsal aspect of her R hand near the base of her 5th MC.    Review of Systems  Constitutional: Negative for chills, fever and weight loss.  Respiratory: Positive for cough.   Cardiovascular: Positive for palpitations.  Musculoskeletal: Positive for falls and joint pain.  Neurological: Positive for tingling.  Endo/Heme/Allergies: Does not bruise/bleed easily.  Psychiatric/Behavioral: Positive for depression. The patient is nervous/anxious and has insomnia.     Otherwise per HPI.  HISTORY & PERTINENT PRIOR DATA:  No specialty comments available. She reports that she has been smoking cigarettes.  She has a 11.50 pack-year smoking history. she has never used smokeless tobacco. No results for input(s): HGBA1C, LABURIC, CREATINE in the last 8760 hours.  Invalid input(s): CR Allergies reviewed per EMR Prior to Admission medications   Medication Sig Start Date End Date Taking? Authorizing Provider  acetaminophen (TYLENOL) 500 MG tablet Take 500-1,000 mg by mouth every 6  (six) hours as needed for moderate pain.    [provider]  ALPRAZolam Prudy Feeler(XANAX) 0.25 MG tablet Take 1 tablet (0.25 mg total) by mouth 2 (two) times daily as needed for anxiety. 10/01/16   Wendy Evans, Wendy E, MD  cetirizine (ZYRTEC) 10 MG tablet Take 10 mg by mouth daily.    [provider]  cholecalciferol (VITAMIN Evans) 1000 units tablet Take 1,000 Units by mouth daily.    [provider]  Diclofenac Sodium (PENNSAID) 2 % SOLN Place 1 application 2 (two) times daily onto the skin. 12/16/16   Wendy Evans, Wendy Karges D, DO  Diclofenac Sodium (PENNSAID) 2 % SOLN Place 1 application 2 (two) times daily for 1 day onto the skin. 12/16/16 12/17/16  Wendy Evans, Wendy Evans D, DO  ferrous sulfate 325 (65 FE) MG tablet Take 325 mg by mouth daily with breakfast.    [provider]  ketotifen (ALAWAY) 0.025 % ophthalmic solution Place 1 drop into both eyes 2 (two) times daily as needed (allergies).    [provider]  Olopatadine HCl 0.2 % SOLN INSTILL 1 DROP INTO AFFECTED EYE(S) EVERY DAY 10/22/16   [provider]  sertraline (ZOLOFT) 50 MG tablet Take 1 tablet (50 mg total) by mouth daily. 10/01/16   Wendy Evans, Wendy E, MD  vitamin C (ASCORBIC ACID) 500 MG tablet Take 500 mg by mouth daily.    [provider]   Patient Active Problem List   Diagnosis Date Noted  . Trigger ring finger of right  hand 12/16/2016  . Closed fracture of 4th metacarpal 11/13/2016  . Physical exam 10/01/2016  . Adjustment disorder with mixed anxiety and depressed mood 10/01/2016  . 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 Tobacco use 03/19/2016  . Menorrhagia 03/19/2016  . Weight gain 03/19/2016   Past Medical History:  Diagnosis Date  . Allergy   . Anemia   . Anxiety   . Bronchitis 07/2016   x 3 times in her life  . Complication of anesthesia    SEIZURES BECAUSE OF ALLERGY TO CODEINE  . Depression   . Headache    cold dark room/sleep - no med  . History of chicken pox   .  Hypertension    ONLY WITH REACTION TO PROPHYRIA  . Joint pain    Rare - reaction to porphyria only  . Palpitation     Rare -  only with porphyria reaction  . Porphyria variegata (HCC)   . SVD (spontaneous vaginal delivery)    x 1  . Tendonitis    Hx - no problems now   Family History  Problem Relation Age of Onset  . Hypertension Maternal Grandmother   . Hyperlipidemia Maternal Grandmother   . Cancer Maternal Grandmother        skin  . Dementia Maternal Grandmother   . Heart disease Maternal Grandmother        valve replacement  . Hypertension Maternal Grandfather   . Diabetes Maternal Grandfather    Past Surgical History:  Procedure Laterality Date  . BREAST REDUCTION SURGERY    . TYMPANOSTOMY TUBE PLACEMENT    . WISDOM TOOTH EXTRACTION     Social History   Occupational History  . Not on file  Tobacco Use  . Smoking status: Current Every Day Smoker    Packs/day: 0.50    Years: 23.00    Pack years: 11.50    Types: Cigarettes  . Smokeless tobacco: Never Used  Substance and Sexual Activity  . Alcohol use: Yes    Comment: OCCAS  . Drug use: No  . Sexual activity: No    Birth control/protection: Rhythm    OBJECTIVE:  VS:  HT:5\' 4"  (162.6 cm)   WT:163 lb (73.9 kg)  BMI:27.97    BP:132/84  HR:76bpm  TEMP: ( )  RESP:98 % EXAM: Findings:  Right hand is overall well aligned.  She has a small amount and prominence over the carpometacarpal junction of the fourth and fifth fingers.  She has full flexion extension of the hand.  Full grip strength.  She has a small amount of pain over the A1 pulley of the fourth finger this is mild.  No significant banding or a prominence of the flexor tendon.  Good wrist extension.  No pain along the anatomic snuffbox.    RADIOLOGY: Prior [facility] ultrasound showed small nondisplaced fracture of the proximal metacarpal heads.  X-rays reviewed again today are reassuring.  No significant changes. ASSESSMENT & PLAN:     ICD-10-CM   1.  Right hand pain M79.641 DG Hand Complete Right  2. Closed displaced fracture of base of fourth metacarpal bone of right hand, initial encounter S62.314A   3. Trigger ring finger of right hand M65.341    ================================================================= Closed fracture of 4th metacarpal 90% improved at this time.  Suspect another several weeks before complete resolution.  she has been able to return to activities as tolerated and has only minimal discomfort with hand squeeze test.  Sample of  Pennsaid provided today and she  will call us if she is interested in a prescription for this.  Trigger ring finger of right hand She does have a small amount of early triggering.  We will have her try topical Pennsaid and if any worsening or lack of improvement she will plan to follow-up and consider injection at that time.  No notes on file ================================================================= There are no Patient Instructions on file for this visit. ================================================================= Future Appointments  Date Time Provider Department Center  01/25/2017  8:30 AM Wendy Hatch, MD [facility] None    Follow-up: Return if symptoms worsen or fail to improve.   CMA/ATC served as Neurosurgeon during this visit. History, Physical, and Plan performed by medical provider. Documentation and orders reviewed and attested to.      Gaspar Bidding, DO    [facility]

## 2016-12-16 NOTE — Assessment & Plan Note (Signed)
She does have a small amount of early triggering.  We will have her try topical Pennsaid and if any worsening or lack of improvement she will plan to follow-up and consider injection at that time.

## 2017-01-04 ENCOUNTER — Other Ambulatory Visit: Payer: Self-pay | Admitting: General Practice

## 2017-01-04 MED ORDER — SERTRALINE HCL 50 MG PO TABS: 50.0000 mg | ORAL_TABLET | Freq: Every day | ORAL | 0 refills | Status: DC

## 2017-01-19 ENCOUNTER — Encounter: Payer: Self-pay | Admitting: Sports Medicine

## 2017-01-19 NOTE — Assessment & Plan Note (Signed)
Appreciated only on ultrasound today.   Discussed options for mobilization versus avoidance activities.  She would like to undergo avoidance activities only and will consider buddy taping.

## 2017-01-25 ENCOUNTER — Ambulatory Visit: Payer: BLUE CROSS/BLUE SHIELD | Admitting: Family Medicine

## 2017-02-24 DIAGNOSIS — N72 Inflammatory disease of cervix uteri: Secondary | ICD-10-CM | POA: Diagnosis not present

## 2017-02-24 DIAGNOSIS — N941 Unspecified dyspareunia: Secondary | ICD-10-CM | POA: Diagnosis not present

## 2017-05-18 ENCOUNTER — Other Ambulatory Visit: Payer: Self-pay | Admitting: Family Medicine

## 2017-05-18 NOTE — Telephone Encounter (Signed)
Last OV 01/25/2017 Fill Date: 02/15/2017  Ok to Refill?

## 2017-11-11 ENCOUNTER — Encounter: Payer: Self-pay | Admitting: Family Medicine

## 2017-11-11 ENCOUNTER — Ambulatory Visit: Payer: BLUE CROSS/BLUE SHIELD | Admitting: Family Medicine

## 2017-11-11 ENCOUNTER — Other Ambulatory Visit: Payer: Self-pay

## 2017-11-11 VITALS — BP 138/82 | HR 95 | Temp 98.9°F | Resp 16 | Ht 64.0 in | Wt 173.5 lb

## 2017-11-11 DIAGNOSIS — R635 Abnormal weight gain: Secondary | ICD-10-CM

## 2017-11-11 DIAGNOSIS — R030 Elevated blood-pressure reading, without diagnosis of hypertension: Secondary | ICD-10-CM | POA: Diagnosis not present

## 2017-11-11 DIAGNOSIS — Z111 Encounter for screening for respiratory tuberculosis: Secondary | ICD-10-CM

## 2017-11-11 DIAGNOSIS — F4323 Adjustment disorder with mixed anxiety and depressed mood: Secondary | ICD-10-CM

## 2017-11-11 LAB — HEPATIC FUNCTION PANEL
ALK PHOS: 90 U/L (ref 39–117)
ALT: 38 U/L — AB (ref 0–35)
AST: 29 U/L (ref 0–37)
Albumin: 4.1 g/dL (ref 3.5–5.2)
BILIRUBIN DIRECT: 0.1 mg/dL (ref 0.0–0.3)
Total Bilirubin: 0.5 mg/dL (ref 0.2–1.2)
Total Protein: 6.9 g/dL (ref 6.0–8.3)

## 2017-11-11 LAB — CBC WITH DIFFERENTIAL/PLATELET
BASOS PCT: 0.9 % (ref 0.0–3.0)
Basophils Absolute: 0.1 10*3/uL (ref 0.0–0.1)
EOS ABS: 0.1 10*3/uL (ref 0.0–0.7)
Eosinophils Relative: 1.3 % (ref 0.0–5.0)
HCT: 39.6 % (ref 36.0–46.0)
Hemoglobin: 12.9 g/dL (ref 12.0–15.0)
Lymphocytes Relative: 25.5 % (ref 12.0–46.0)
Lymphs Abs: 2.2 10*3/uL (ref 0.7–4.0)
MCHC: 32.6 g/dL (ref 30.0–36.0)
MCV: 91.8 fl (ref 78.0–100.0)
MONO ABS: 0.6 10*3/uL (ref 0.1–1.0)
Monocytes Relative: 7.1 % (ref 3.0–12.0)
NEUTROS ABS: 5.6 10*3/uL (ref 1.4–7.7)
Neutrophils Relative %: 65.2 % (ref 43.0–77.0)
PLATELETS: 185 10*3/uL (ref 150.0–400.0)
RBC: 4.31 Mil/uL (ref 3.87–5.11)
RDW: 15 % (ref 11.5–15.5)
WBC: 8.6 10*3/uL (ref 4.0–10.5)

## 2017-11-11 LAB — T3, FREE: T3 FREE: 3.4 pg/mL (ref 2.3–4.2)

## 2017-11-11 LAB — LIPID PANEL
Cholesterol: 195 mg/dL (ref 0–200)
HDL: 53.3 mg/dL (ref >39.00)
LDL Cholesterol: 118 mg/dL — ABNORMAL HIGH (ref 0–99)
NONHDL: 141.58
Total CHOL/HDL Ratio: 4
Triglycerides: 120 mg/dL (ref 0.0–149.0)
VLDL: 24 mg/dL (ref 0.0–40.0)

## 2017-11-11 LAB — BASIC METABOLIC PANEL
BUN: 11 mg/dL (ref 6–23)
CHLORIDE: 105 meq/L (ref 96–112)
CO2: 29 meq/L (ref 19–32)
CREATININE: 0.65 mg/dL (ref 0.40–1.20)
Calcium: 9.4 mg/dL (ref 8.4–10.5)
GFR: 105.02 mL/min (ref >60.00)
Glucose, Bld: 112 mg/dL — ABNORMAL HIGH (ref 70–99)
POTASSIUM: 4 meq/L (ref 3.5–5.1)
Sodium: 138 mEq/L (ref 135–145)

## 2017-11-11 LAB — T4, FREE: FREE T4: 1.23 ng/dL (ref 0.60–1.60)

## 2017-11-11 LAB — TSH: TSH: 0.51 u[IU]/mL (ref 0.35–4.50)

## 2017-11-11 MED ORDER — ESCITALOPRAM OXALATE 10 MG PO TABS: 10.0000 mg | ORAL_TABLET | Freq: Every day | ORAL | 3 refills | Status: DC

## 2017-11-11 NOTE — Progress Notes (Signed)
   Subjective:    Patient ID: Wendy Evans, female    DOB: 1975-03-27, 42 y.o.   MRN: [id number]  HPI Weight gain- pt has gained 10 lbs since last visit.  'i'm not a big dieter'- but pt attempted an online diet (1200-1500 calories/day) and reports she kept gaining weight.  Walking 2-4x/week for 30 minutes.  + family hx of thyroid issues.  Denies fatigue but 'really dry skin', 'losing some hair'.  Elevated BP- pt reports today's # is actually a 'good reading for her'.  She reports she is having a porphyria flare and this tends to elevate her BP.  No CP, SOB, HAs, visual changes, edema.  TB test- required for work  Depression- ongoing issue for pt but no longer controlled on sertraline.  She is in a very stressful home situation.   Review of Systems For ROS see HPI     Objective:   Physical Exam  Constitutional: She is oriented to person, place, and time. She appears well-developed and well-nourished. No distress.  HENT:  Head: Normocephalic and atraumatic.  Eyes: Pupils are equal, round, and reactive to light. Conjunctivae and EOM are normal.  Neck: Normal range of motion. Neck supple. No thyromegaly present.  Cardiovascular: Normal rate, regular rhythm, normal heart sounds and intact distal pulses.  No murmur heard. Pulmonary/Chest: Effort normal and breath sounds normal. No respiratory distress.  Abdominal: Soft. She exhibits no distension. There is no tenderness.  Musculoskeletal: She exhibits no edema.  Lymphadenopathy:    She has no cervical adenopathy.  Neurological: She is alert and oriented to person, place, and time.  Skin: Skin is warm and dry.  Psychiatric: She has a normal mood and affect. Her behavior is normal.  Vitals reviewed.         Assessment & Plan:

## 2017-11-11 NOTE — Assessment & Plan Note (Signed)
Pt continues to smoke.  Not interested in quitting.  States she is in a current flare which is causing elevated BP.

## 2017-11-11 NOTE — Patient Instructions (Signed)
Follow up in 1 month to recheck mood and BP We'll notify you of your lab results and make any changes if needed Continue to work on healthy diet and regular exercise- you can do it! START the Lexapro 10mg  once daily for mood Call with any questions or concerns Hang in there!!

## 2017-11-13 LAB — QUANTIFERON-TB GOLD PLUS
Mitogen-NIL: 9.43 IU/mL
NIL: 0.02 IU/mL
QuantiFERON-TB Gold Plus: NEGATIVE
TB1-NIL: 0.01 [IU]/mL
TB2-NIL: 0.01 IU/mL

## 2017-11-15 ENCOUNTER — Encounter: Payer: Self-pay | Admitting: General Practice

## 2017-11-23 ENCOUNTER — Encounter: Payer: Self-pay | Admitting: Family Medicine

## 2017-11-24 ENCOUNTER — Encounter: Payer: Self-pay | Admitting: *Deleted

## 2017-12-03 ENCOUNTER — Ambulatory Visit (INDEPENDENT_AMBULATORY_CARE_PROVIDER_SITE_OTHER): Payer: BLUE CROSS/BLUE SHIELD

## 2017-12-03 DIAGNOSIS — Z23 Encounter for immunization: Secondary | ICD-10-CM

## 2017-12-14 ENCOUNTER — Ambulatory Visit: Payer: BLUE CROSS/BLUE SHIELD | Admitting: Family Medicine

## 2017-12-15 NOTE — Assessment & Plan Note (Signed)
BP is elevated today but pt reports this is 'good'.  She is currently having pain due to her porphyria flare which causes elevated BP.  Will not start meds at this time, but pt to f/u in 1 month to recheck BP and start meds if needed.

## 2017-12-15 NOTE — Assessment & Plan Note (Signed)
Deteriorated.  No longer having benefit from Sertraline.  Switch to Lexapro and monitor for improvement.  Pt expressed understanding and is in agreement w/ plan.

## 2017-12-15 NOTE — Assessment & Plan Note (Signed)
Pt has gained 10 lbs since last visit.  She is dieting and walking.  She has family hx of thyroid issues and is concerned that this is playing a part.  Check labs to ensure no metabolic issues.  Will address if present.

## 2017-12-16 ENCOUNTER — Other Ambulatory Visit: Payer: Self-pay | Admitting: Family Medicine

## 2017-12-16 DIAGNOSIS — R220 Localized swelling, mass and lump, head: Secondary | ICD-10-CM | POA: Diagnosis not present

## 2017-12-16 DIAGNOSIS — R221 Localized swelling, mass and lump, neck: Secondary | ICD-10-CM | POA: Diagnosis not present

## 2018-02-04 ENCOUNTER — Ambulatory Visit: Payer: BLUE CROSS/BLUE SHIELD | Admitting: Physician Assistant

## 2018-02-04 ENCOUNTER — Encounter: Payer: Self-pay | Admitting: Physician Assistant

## 2018-02-04 ENCOUNTER — Telehealth: Payer: Self-pay

## 2018-02-04 ENCOUNTER — Other Ambulatory Visit: Payer: Self-pay

## 2018-02-04 VITALS — BP 142/90 | HR 78 | Temp 97.8°F | Resp 14 | Ht 64.0 in | Wt 175.0 lb

## 2018-02-04 DIAGNOSIS — K047 Periapical abscess without sinus: Secondary | ICD-10-CM | POA: Diagnosis not present

## 2018-02-04 LAB — COMPREHENSIVE METABOLIC PANEL
ALT: 54 U/L — ABNORMAL HIGH (ref 0–35)
AST: 33 U/L (ref 0–37)
Albumin: 3.9 g/dL (ref 3.5–5.2)
Alkaline Phosphatase: 69 U/L (ref 39–117)
BUN: 12 mg/dL (ref 6–23)
CALCIUM: 8.8 mg/dL (ref 8.4–10.5)
CHLORIDE: 108 meq/L (ref 96–112)
CO2: 19 meq/L (ref 19–32)
CREATININE: 0.59 mg/dL (ref 0.40–1.20)
GFR: 117.31 mL/min (ref >60.00)
Glucose, Bld: 82 mg/dL (ref 70–99)
POTASSIUM: 3.7 meq/L (ref 3.5–5.1)
SODIUM: 137 meq/L (ref 135–145)
Total Bilirubin: 0.5 mg/dL (ref 0.2–1.2)
Total Protein: 6.8 g/dL (ref 6.0–8.3)

## 2018-02-04 LAB — URINALYSIS, ROUTINE W REFLEX MICROSCOPIC
KETONES UR: NEGATIVE
Leukocytes, UA: NEGATIVE
NITRITE: NEGATIVE
PH: 6.5 (ref 5.0–8.0)
Specific Gravity, Urine: >=1.03 — AB (ref 1.000–1.030)
Urine Glucose: NEGATIVE
Urobilinogen, UA: 0.2 (ref 0.0–1.0)
WBC UA: NONE SEEN (ref 0–?)

## 2018-02-04 NOTE — Progress Notes (Signed)
Patient presents to clinic today for ER/hospital follow-up. Patient presented to [facility] ER at [place] on 01/31/18 with c/o fever and flu-like symptoms. Workup there included labs (CBC revealing leukocytosis, stable BMP and negative urine preg, negative viral panel), CT Head (negative) and Ct Facial Bones (consistent with abscess of first right lower incisor with sign of soft tissue swelling of jaw in same region). Lumbar puncture was discussed but not performed.Marland Kitchen ENT was consulted and patient was transferred to [facility] for further assessment and management. Was given course of IV Clindamycin prior to [facility].   After transfer to [facility], patient was assessed by specialist who felt she was stable on antibiotic therapy and needed outpatient follow-up with her oral surgeon (Dr. Francene Boyers). Was discharged home to continue oral Clindamycin.   Since discharge, patient endorses still having pain in area of abscess but is milder. Denies fever or chills but notes fatigue. Denies new or worsening symptoms. Has appointment with her oral surgeon at 3:00 PM today.  Past Medical History:  Diagnosis Date  . Allergy   . Anemia   . Anxiety   . Bronchitis 07/2016   x 3 times in her life  . Complication of anesthesia    SEIZURES BECAUSE OF ALLERGY TO CODEINE  . Depression   . Headache    cold dark room/sleep - no med  . History of chicken pox   . Hypertension    ONLY WITH REACTION TO PROPHYRIA  . Joint pain    Rare - reaction to porphyria only  . Palpitation     Rare -  only with porphyria reaction  . 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 SVD (spontaneous vaginal delivery)    x 1  . Tendonitis    Hx - no problems now    Current Outpatient Medications on File Prior to Visit  Medication Sig Dispense Refill  . acetaminophen (TYLENOL) 500 MG tablet Take 500-1,000 mg by mouth every 6 (six) hours as needed for moderate pain.    Marland Kitchen ALPRAZolam (XANAX) 0.25 MG tablet Take 1 tablet (0.25 mg total) by  mouth 2 (two) times daily as needed for anxiety. 30 tablet 1  . cetirizine (ZYRTEC) 10 MG tablet Take 10 mg by mouth daily.    . cholecalciferol (VITAMIN D) 1000 units tablet Take 1,000 Units by mouth daily.    . clindamycin (CLEOCIN) 300 MG capsule Take 300 mg by mouth 3 (three) times daily.    Marland Kitchen escitalopram (LEXAPRO) 10 MG tablet TAKE 1 TABLET BY MOUTH EVERY DAY 90 tablet 1  . ketotifen (ALAWAY) 0.025 % ophthalmic solution Place 1 drop into both eyes 2 (two) times daily as needed (allergies).    . Olopatadine HCl 0.2 % SOLN INSTILL 1 DROP INTO AFFECTED EYE(S) EVERY DAY  0  . sertraline (ZOLOFT) 50 MG tablet TAKE 1 TABLET BY MOUTH EVERY DAY 90 tablet 0  . tetrahydrozoline 0.05 % ophthalmic solution Apply 1 drop to eye daily.     No current facility-administered medications on file prior to visit.     Allergies  Allergen Reactions  . Codeine Nausea And Vomiting    Vomits to point of tearing stomach lining, causing bleeding   . Erythromycin Anaphylaxis, Swelling and Rash    Any "mycins" including Neomycin  . Vancomycin Other (See Comments)    Erythromycin - Anaphylaxis  . Amoxicillin Rash  . Latex Itching    Severity increases when in contact with moisture: fluid, sweat, etc.   . 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 Propoxyphene Nausea And Vomiting  . Banana Other (See Comments)    Severe muscle pain and cramps   . Hydrocodone Other (See Comments)    Because Has Varie[place] Porphyria, CAN CAUSE CARDIAC ARREST  . Meperidine Other (See Comments)    Unknown  . Other     Darvocet   . Oxytocin Itching  . Penicillins Rash    Childhood allergy Has patient had a PCN reaction causing immediate rash, facial/tongue/throat swelling, SOB or lightheadedness with hypotension: Unknown Has patient had a PCN reaction causing severe rash involving mucus membranes or skin necrosis: Unknown Has patient had a PCN reaction that required hospitalization: No Has patient  had a PCN reaction occurring within the last 10 years: No If all of the above answers are "NO", then may proceed with Cephalosporin use.   . Sulfa Antibiotics Rash    Family History  Problem Relation Age of Onset  . Hypertension Maternal Grandmother   . Hyperlipidemia Maternal Grandmother   . Cancer Maternal Grandmother        skin  . Dementia Maternal Grandmother   . Heart disease Maternal Grandmother        valve replacement  . Hypertension Maternal Grandfather   . Diabetes Maternal Grandfather     Social History   Socioeconomic History  . Marital status: Married    Spouse name: Not on file  . Number of children: Not on file  . Years of education: Not on file  . Highest education level: Not on file  Occupational History  . Not on file  Social Needs  . Financial resource strain: Not on file  . Food insecurity:    Worry: Not on file    Inability: Not on file  . Transportation needs:    Medical: Not on file    Non-medical: Not on file  Tobacco Use  . Smoking status: Current Every Day Smoker    Packs/day: 0.50    Years: 23.00    Pack years: 11.50    Types: Cigarettes  . Smokeless tobacco: Never Used  Substance and Sexual Activity  . Alcohol use: Yes    Comment: OCCAS  . Drug use: No  . Sexual activity: Never    Birth control/protection: Rhythm  Lifestyle  . Physical activity:    Days per week: Not on file    Minutes per session: Not on file  . Stress: Not on file  Relationships  . Social connections:    Talks on phone: Not on file    Gets together: Not on file    Attends religious service: Not on file    Active member of club or organization: Not on file    Attends meetings of clubs or organizations: Not on file    Relationship status: Not on file  Other Topics Concern  . Not on file  Social History Narrative  . Not on file   Review of Systems - See HPI.  All other ROS are negative.  BP (!) 142/90   Pulse 78   Temp 97.8 F (36.6 C) (Oral)   Resp  14   Ht _0  (1.626 m)   Wt 175 lb (79.4 kg)   SpO2 98%   BMI 30.04 kg/m   Physical Exam Vitals signs and nursing note reviewed.  Constitutional:      Appearance: Normal appearance.  HENT:     Head: Normocephalic and atraumatic.     Right  Ear: Tympanic membrane normal.     Left Ear: Tympanic membrane normal.     Nose: Nose normal.     Mouth/Throat:     Mouth: Mucous membranes are moist. No oral lesions.     Dentition: Dental tenderness (first right lower incisor.) present. No gingival swelling or dental caries.     Tongue: No lesions.     Pharynx: Uvula midline. No pharyngeal swelling or posterior oropharyngeal erythema.  Eyes:     Conjunctiva/sclera: Conjunctivae normal.  Neck:     Musculoskeletal: Normal range of motion and neck supple.  Cardiovascular:     Rate and Rhythm: Normal rate and regular rhythm.     Pulses: Normal pulses.     Heart sounds: Normal heart sounds.  Pulmonary:     Effort: Pulmonary effort is normal.     Breath sounds: Normal breath sounds.  Neurological:     General: No focal deficit present.     Mental Status: She is alert and oriented to person, place, and time.     Recent Results (from the past 2160 hour(s))  QuantiFERON-TB Gold Plus     Status: None   Collection Time: 11/11/17  1:45 PM  Result Value Ref Range   QuantiFERON-TB Gold Plus NEGATIVE NEGATIVE    Comment: Negative test result. M. tuberculosis complex  infection unlikely.    NIL 0.02 IU/mL   Mitogen-NIL 9.43 IU/mL   TB1-NIL 0.01 IU/mL   TB2-NIL 0.01 IU/mL    Comment: . The Nil tube value reflects the background interferon gamma immune response of the patient's blood sample. This value has been subtracted from the patient's displayed TB and Mitogen results. . Lower than expected results with the Mitogen tube prevent false-negative Quantiferon readings by detecting a patient with a potential immune suppressive condition and/or suboptimal pre-analytical specimen  handling. . The TB1 Antigen tube is coated with the M. tuberculosis-specific antigens designed to elicit responses from TB antigen primed CD4+ helper T-lymphocytes. . The TB2 Antigen tube is coated with the M. tuberculosis-specific antigens designed to elicit responses from TB antigen primed CD4+ helper and CD8+ cytotoxic T-lymphocytes. . For additional information, please refer to https://education.questdiagnostics.com/faq/FAQ204 (This link is being provided for informational/ educational purposes only.) .   Lipid panel     Status: Abnormal   Collection Time: 11/11/17  1:45 PM  Result Value Ref Range   Cholesterol 195 0 - 200 mg/dL    Comment: ATP III Classification       Desirable:  < 200 mg/dL               Borderline High:  200 - 239 mg/dL          High:  > = 240 mg/dL   Triglycerides 120.0 0.0 - 149.0 mg/dL    Comment: Normal:  <150 mg/dLBorderline High:  150 - 199 mg/dL   HDL 53.30 >39.00 mg/dL   VLDL 24.0 0.0 - 40.0 mg/dL   LDL Cholesterol 118 (H) 0 - 99 mg/dL   Total CHOL/HDL Ratio 4     Comment:                Men          Women1/2 Average Risk     3.4          3.3Average Risk          5.0          4.42X Average Risk          9.6  7.13X Average Risk          15.0          11.0                       NonHDL 141.58     Comment: NOTE:  Non-HDL goal should be 30 mg/dL higher than patient's LDL goal (i.e. LDL goal of < 70 mg/dL, would have non-HDL goal of < 100 mg/dL)  Basic metabolic panel     Status: Abnormal   Collection Time: 11/11/17  1:45 PM  Result Value Ref Range   Sodium 138 135 - 145 mEq/L   Potassium 4.0 3.5 - 5.1 mEq/L   Chloride 105 96 - 112 mEq/L   CO2 29 19 - 32 mEq/L   Glucose, Bld 112 (H) 70 - 99 mg/dL   BUN 11 6 - 23 mg/dL   Creatinine, Ser 0.65 0.40 - 1.20 mg/dL   Calcium 9.4 8.4 - 10.5 mg/dL   GFR 105.02 >60.00 mL/min  Hepatic function panel     Status: Abnormal   Collection Time: 11/11/17  1:45 PM  Result Value Ref Range   Total Bilirubin  0.5 0.2 - 1.2 mg/dL   Bilirubin, Direct 0.1 0.0 - 0.3 mg/dL   Alkaline Phosphatase 90 39 - 117 U/L   AST 29 0 - 37 U/L   ALT 38 (H) 0 - 35 U/L   Total Protein 6.9 6.0 - 8.3 g/dL   Albumin 4.1 3.5 - 5.2 g/dL  TSH     Status: None   Collection Time: 11/11/17  1:45 PM  Result Value Ref Range   TSH 0.51 0.35 - 4.50 uIU/mL  CBC with Differential/Platelet     Status: None   Collection Time: 11/11/17  1:45 PM  Result Value Ref Range   WBC 8.6 4.0 - 10.5 K/uL   RBC 4.31 3.87 - 5.11 Mil/uL   Hemoglobin 12.9 12.0 - 15.0 g/dL   HCT 39.6 36.0 - 46.0 %   MCV 91.8 78.0 - 100.0 fl   MCHC 32.6 30.0 - 36.0 g/dL   RDW 15.0 11.5 - 15.5 %   Platelets 185.0 150.0 - 400.0 K/uL   Neutrophils Relative % 65.2 43.0 - 77.0 %   Lymphocytes Relative 25.5 12.0 - 46.0 %   Monocytes Relative 7.1 3.0 - 12.0 %   Eosinophils Relative 1.3 0.0 - 5.0 %   Basophils Relative 0.9 0.0 - 3.0 %   Neutro Abs 5.6 1.4 - 7.7 K/uL   Lymphs Abs 2.2 0.7 - 4.0 K/uL   Monocytes Absolute 0.6 0.1 - 1.0 K/uL   Eosinophils Absolute 0.1 0.0 - 0.7 K/uL   Basophils Absolute 0.1 0.0 - 0.1 K/uL  T4, free     Status: None   Collection Time: 11/11/17  1:45 PM  Result Value Ref Range   Free T4 1.23 0.60 - 1.60 ng/dL    Comment: Specimens from patients who are undergoing biotin therapy and /or ingesting biotin supplements may contain high levels of biotin.  The higher biotin concentration in these specimens interferes with this Free T4 assay.  Specimens that contain high levels  of biotin may cause false high results for this Free T4 assay.  Please interpret results in light of the total clinical presentation of the patient.    T3, free     Status: None   Collection Time: 11/11/17  1:45 PM  Result Value Ref Range   T3, Free 3.4 2.3 -  4.2 pg/mL    Assessment/Plan: 1. Dental abscess Taking Clindamycin as directed. Improvement after completing IV Clindamycin in ER. Still painful. Has appt with oral surgeon later today. Will have her  continue ABX and follow-up with specialist. Repeat CBC today. 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 No current symptoms but patient requesting labs today. Order placed for CBC, UA and CMP. - Urinalysis, Routine w reflex microscopic   Leeanne Rio, PA-C

## 2018-02-04 NOTE — Patient Instructions (Signed)
Please go to the lab today for blood work.  I will call you with your results. We will alter treatment regimen(s) if indicated by your results.   Please follow up with your specialist this afternoon for further management regarding dental abscess.

## 2018-02-04 NOTE — Telephone Encounter (Signed)
[facility] called, unable to test CBC, not enough specimen. Unable to stick more than once, pt non compliant.

## 2018-02-07 ENCOUNTER — Other Ambulatory Visit: Payer: Self-pay | Admitting: *Deleted

## 2018-02-07 DIAGNOSIS — R7401 Elevation of levels of liver transaminase levels: Secondary | ICD-10-CM

## 2018-02-07 DIAGNOSIS — R740 Nonspecific elevation of levels of transaminase and lactic acid dehydrogenase [LDH]: Principal | ICD-10-CM

## 2018-02-18 ENCOUNTER — Ambulatory Visit: Payer: Managed Care, Other (non HMO) | Admitting: Physician Assistant

## 2018-02-18 ENCOUNTER — Other Ambulatory Visit (INDEPENDENT_AMBULATORY_CARE_PROVIDER_SITE_OTHER): Payer: Managed Care, Other (non HMO)

## 2018-02-18 ENCOUNTER — Encounter: Payer: Self-pay | Admitting: Physician Assistant

## 2018-02-18 DIAGNOSIS — R740 Nonspecific elevation of levels of transaminase and lactic acid dehydrogenase [LDH]: Secondary | ICD-10-CM

## 2018-02-18 DIAGNOSIS — R7401 Elevation of levels of liver transaminase levels: Secondary | ICD-10-CM

## 2018-02-18 LAB — URINALYSIS, ROUTINE W REFLEX MICROSCOPIC
KETONES UR: NEGATIVE
Leukocytes, UA: NEGATIVE
NITRITE: NEGATIVE
SPECIFIC GRAVITY, URINE: 1.025 (ref 1.000–1.030)
Total Protein, Urine: NEGATIVE
Urine Glucose: NEGATIVE
Urobilinogen, UA: 0.2 (ref 0.0–1.0)
pH: 6 (ref 5.0–8.0)

## 2018-02-18 LAB — COMPREHENSIVE METABOLIC PANEL
ALT: 33 U/L (ref 0–35)
AST: 24 U/L (ref 0–37)
Albumin: 4 g/dL (ref 3.5–5.2)
Alkaline Phosphatase: 80 U/L (ref 39–117)
BILIRUBIN TOTAL: 0.6 mg/dL (ref 0.2–1.2)
BUN: 12 mg/dL (ref 6–23)
CO2: 22 meq/L (ref 19–32)
Calcium: 9.3 mg/dL (ref 8.4–10.5)
Chloride: 109 mEq/L (ref 96–112)
Creatinine, Ser: 0.61 mg/dL (ref 0.40–1.20)
GFR: 112.87 mL/min (ref >60.00)
GLUCOSE: 92 mg/dL (ref 70–99)
Potassium: 3.9 mEq/L (ref 3.5–5.1)
SODIUM: 139 meq/L (ref 135–145)
Total Protein: 6.8 g/dL (ref 6.0–8.3)

## 2018-10-06 DIAGNOSIS — M79641 Pain in right hand: Secondary | ICD-10-CM | POA: Insufficient documentation

## 2018-11-29 ENCOUNTER — Telehealth: Payer: Self-pay | Admitting: Family Medicine

## 2018-11-29 NOTE — Telephone Encounter (Signed)
Yes.  Ok to give preservative free

## 2018-11-29 NOTE — Telephone Encounter (Signed)
Ok for preservative free?

## 2018-11-29 NOTE — Telephone Encounter (Signed)
Pt called in asking for a preservative free flu shot, I have her scheduled tomorrow at 9am. Please advise if this is ok?

## 2018-11-29 NOTE — Telephone Encounter (Signed)
Ok for apt

## 2018-11-30 ENCOUNTER — Other Ambulatory Visit: Payer: Self-pay

## 2018-11-30 ENCOUNTER — Ambulatory Visit (INDEPENDENT_AMBULATORY_CARE_PROVIDER_SITE_OTHER): Payer: Managed Care, Other (non HMO)

## 2018-11-30 DIAGNOSIS — Z23 Encounter for immunization: Secondary | ICD-10-CM

## 2018-12-01 ENCOUNTER — Telehealth: Payer: Self-pay

## 2018-12-01 ENCOUNTER — Ambulatory Visit: Payer: Self-pay

## 2018-12-01 IMAGING — DX DG HAND COMPLETE 3+V*R*
3 series · 3 of 3 positions shown · non-contrast
Comparison: None available

CLINICAL DATA: Fall, right hand pain over the fifth metacarpal

EXAM:
RIGHT HAND - COMPLETE 3+ VIEW

[hand pa]
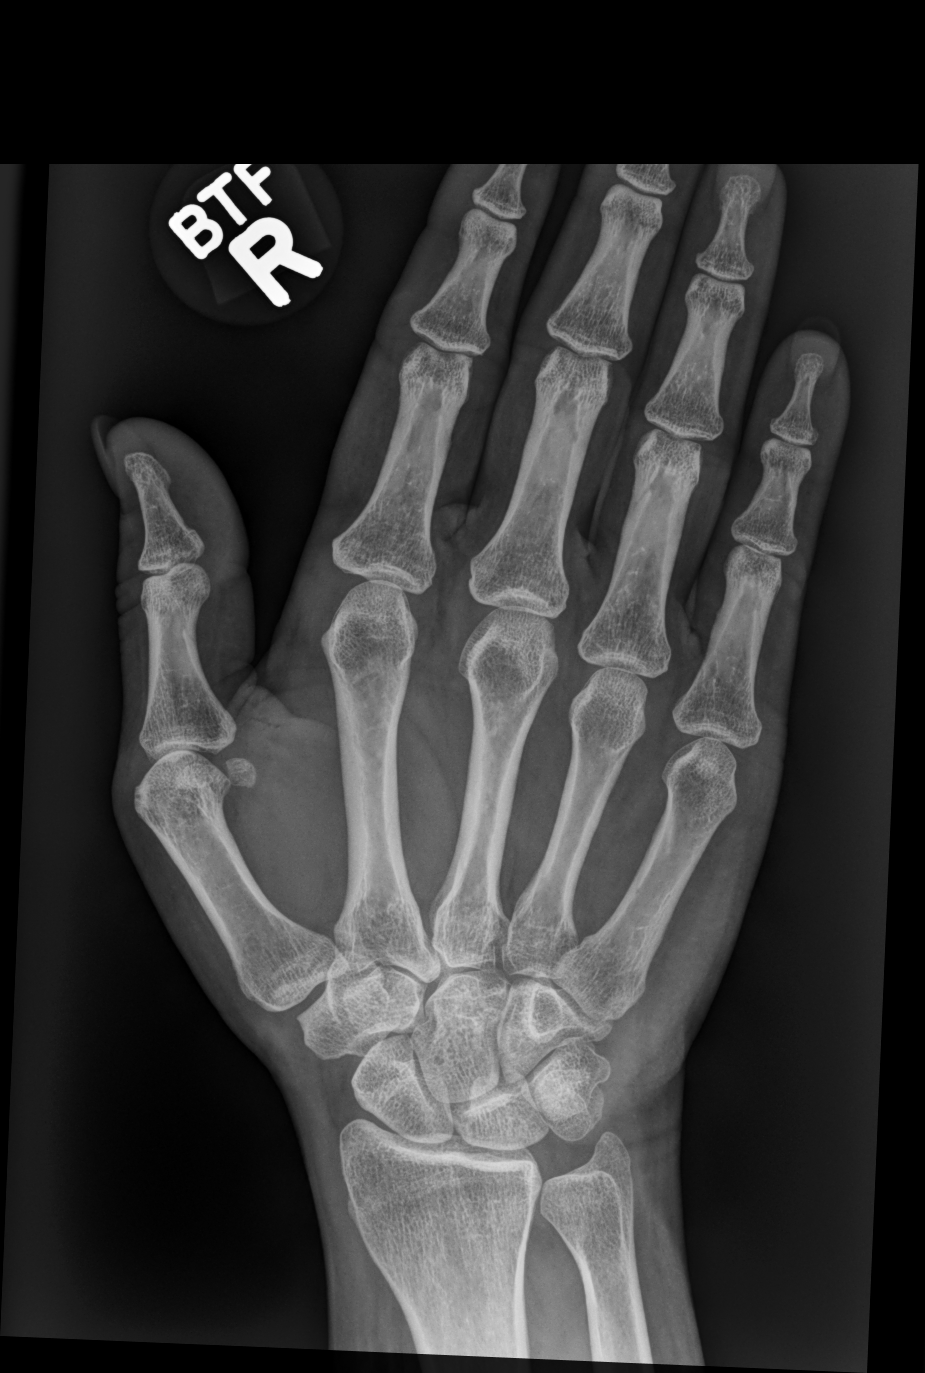

[hand oblique]
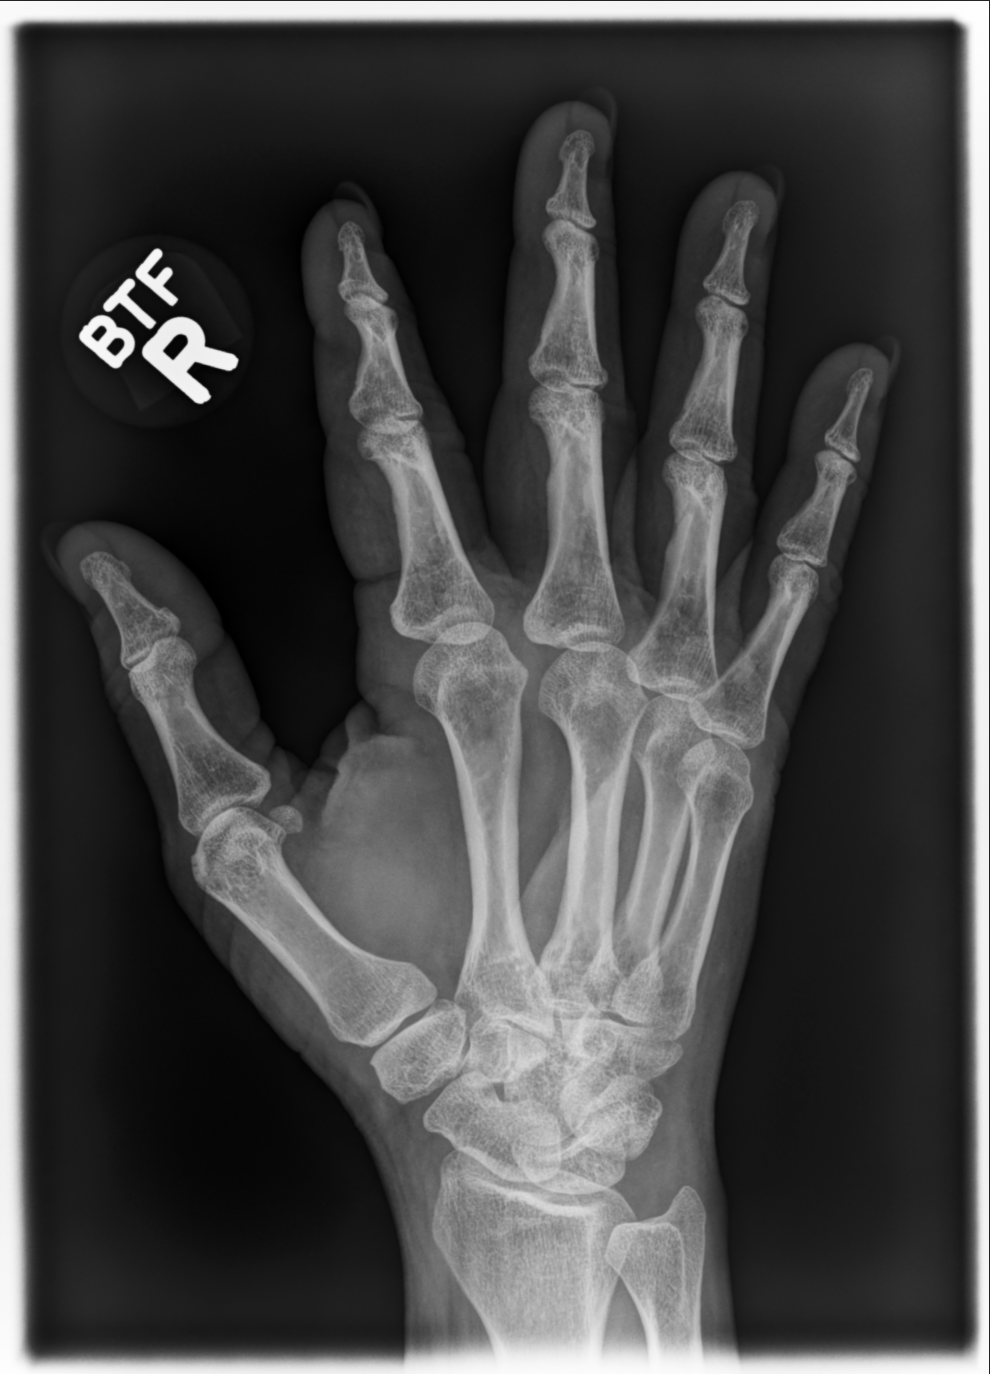

[hand lat]
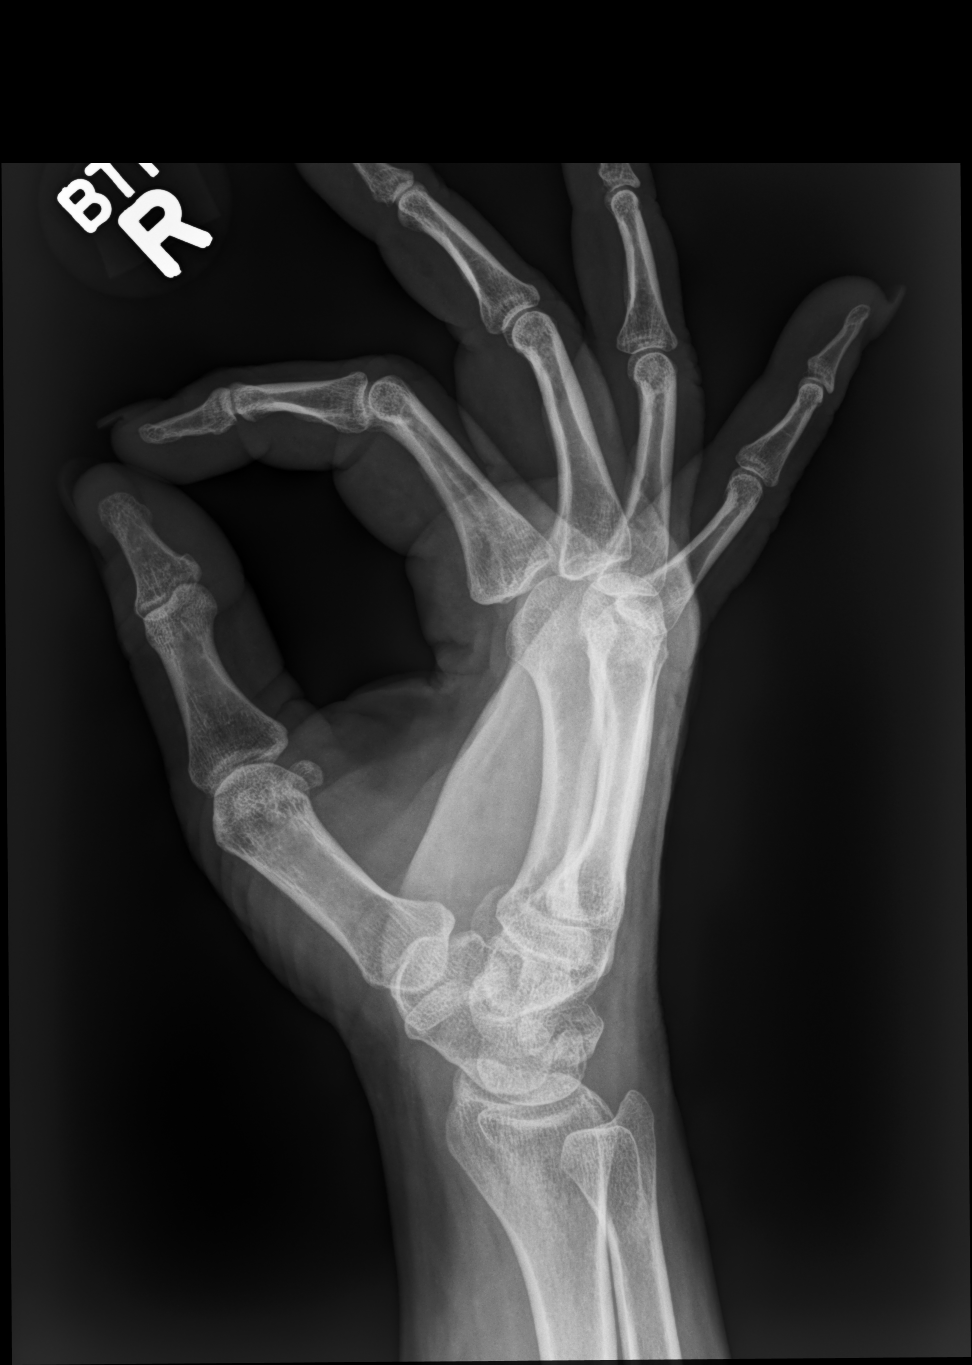

[3 of 3 positions shown; findings below may reference images not displayed]

FINDINGS: There is no evidence of fracture or dislocation. There is no
evidence of arthropathy or other focal bone abnormality. Soft
tissues are unremarkable.
IMPRESSION: Negative.

## 2018-12-01 NOTE — Telephone Encounter (Signed)
Pt. Reports she received her Flu shot yesterday. Last she developed swelling at the site, "the size of a baseball." Area is pink, painful. Spoke with Arbie Cookey in the practice and they will talk to Dr. Birdie Riddle and call her back. Pt. Will be at work. Leave a message and she will call back. Answer Assessment - Initial Assessment Questions 1. SYMPTOMS: "What is the main symptom?" (e.g., redness, swelling, pain)      Swelling - base ball size. Redness 2. ONSET: "When was the vaccine (shot) given?" "How much later did the swelling begin?" (e.g., hours, days ago)      Yesterday 3. SEVERITY: "How bad is it?"      Moderate 4. FEVER: "Is there a fever?" If so, ask: "What is it, how was it measured, and when did it start?"      No 5. IMMUNIZATIONS GIVEN: "What shots have you recently received?"     Flu 6. PAST REACTIONS: "Have you reacted to immunizations before?" If so, ask: "What happened?"     Yes 7. OTHER SYMPTOMS: "Do you have any other symptoms?"     Pain - 3/10  Protocols used: IMMUNIZATION REACTIONS-A-AH

## 2018-12-01 NOTE — Telephone Encounter (Signed)
This sounds like a localized reaction to the shot and is not uncommon.  Tylenol/ibuprofen for pain, ice for swelling.  As long as pt is not having difficulty breathing, swallowing, N/V this is not a true allergic rxn

## 2018-12-01 NOTE — Telephone Encounter (Signed)
Pt was advised by Joellen Jersey of PCP recommendations.

## 2018-12-01 NOTE — Telephone Encounter (Signed)
Patient called in stating that she is experiencing pain, swelling, and redness where her preservative free flu shot was administered yesterday. Advised patient that message would be sent to PCP for recommendation. Please advise.

## 2018-12-01 NOTE — Telephone Encounter (Signed)
Pt spoke with CMA in office. Documented in another encounter.

## 2019-01-17 DIAGNOSIS — S4351XA Sprain of right acromioclavicular joint, initial encounter: Secondary | ICD-10-CM | POA: Insufficient documentation

## 2019-02-08 DIAGNOSIS — M25511 Pain in right shoulder: Secondary | ICD-10-CM | POA: Insufficient documentation

## 2019-12-08 ENCOUNTER — Other Ambulatory Visit: Payer: Self-pay

## 2019-12-08 ENCOUNTER — Ambulatory Visit (INDEPENDENT_AMBULATORY_CARE_PROVIDER_SITE_OTHER): Payer: 59

## 2019-12-08 DIAGNOSIS — Z23 Encounter for immunization: Secondary | ICD-10-CM

## 2020-01-01 ENCOUNTER — Encounter: Payer: Managed Care, Other (non HMO) | Admitting: Family Medicine

## 2020-01-01 DIAGNOSIS — Z0289 Encounter for other administrative examinations: Secondary | ICD-10-CM

## 2020-02-01 ENCOUNTER — Telehealth: Payer: Self-pay | Admitting: Family Medicine

## 2020-02-01 ENCOUNTER — Encounter: Payer: Self-pay | Admitting: Family Medicine

## 2020-02-01 NOTE — Telephone Encounter (Signed)
Patient tested positive for covid through a home test - she has a tickle in her throat and feels congested.  States she is not running a fever.  Is there anything special she should look out for or be doing?  Please advise

## 2020-02-14 ENCOUNTER — Telehealth (INDEPENDENT_AMBULATORY_CARE_PROVIDER_SITE_OTHER): Payer: BC Managed Care – PPO | Admitting: Family Medicine

## 2020-02-14 ENCOUNTER — Encounter: Payer: Self-pay | Admitting: Family Medicine

## 2020-02-14 DIAGNOSIS — Z1211 Encounter for screening for malignant neoplasm of colon: Secondary | ICD-10-CM

## 2020-02-14 DIAGNOSIS — R030 Elevated blood-pressure reading, without diagnosis of hypertension: Secondary | ICD-10-CM | POA: Diagnosis not present

## 2020-02-14 DIAGNOSIS — R002 Palpitations: Secondary | ICD-10-CM | POA: Diagnosis not present

## 2020-02-14 DIAGNOSIS — Z0001 Encounter for general adult medical examination with abnormal findings: Secondary | ICD-10-CM

## 2020-02-14 DIAGNOSIS — Z0000 Encounter for general adult medical examination without abnormal findings: Secondary | ICD-10-CM

## 2020-02-14 NOTE — Progress Notes (Signed)
I connected with  Wendy Evans on 02/14/20 by a video enabled telemedicine application and verified that I am speaking with the correct person using two identifiers.   I discussed the limitations of evaluation and management by telemedicine. The patient expressed understanding and agreed to proceed.

## 2020-02-14 NOTE — Progress Notes (Signed)
Virtual Visit via Video   I connected with patient on 02/14/20 at 10:00 AM EST by a video enabled telemedicine application and verified that I am speaking with the correct person using two identifiers.  Location patient: Home Location provider: [facility], Office Persons participating in the virtual visit: Patient, Provider, CMA (Sabrina M)  I discussed the limitations of evaluation and management by telemedicine and the availability of in person appointments. The patient expressed understanding and agreed to proceed.  Subjective:   HPI:   CPE- due for pap, mammo, colon cancer screening.  Tested + for COVID on 12/23.  Pt was double vaccinated.  Plans to get boosted.  UTD on Tdap, flu.  Reviewed past medical, surgical, family and social histories.   Health Maintenance  Topic Date Due  . COVID-19 Vaccine (1) Never done  . COLONOSCOPY (Pts 45-34yrs Insurance coverage will need to be confirmed)  Never done  . MAMMOGRAM  05/16/2020 (Originally 07/24/2017)  . PAP SMEAR-Modifier  05/16/2020 (Originally 05/19/2019)  . Hepatitis C Screening  02/13/2021 (Originally 08/06/1975)  . HIV Screening  02/13/2021 (Originally 05/26/1988)  . TETANUS/TDAP  03/11/2026     ROS:  Patient reports no vision/ hearing changes, adenopathy,fever, weight change,  persistant/recurrent hoarseness , swallowing issues, chest pain, edema, persistant/recurrent cough, hemoptysis, dyspnea (rest/exertional/paroxysmal nocturnal), gastrointestinal bleeding (melena, rectal bleeding), abdominal pain, significant heartburn, bowel changes, GU symptoms (dysuria, hematuria, incontinence), Gyn symptoms (abnormal  bleeding, pain),  syncope, focal weakness, memory loss, skin/hair/nail changes, abnormal bruising or bleeding, anxiety, or depression.   + intermittent palpitations + elevated BP- pt reports BP is persistently elevated.  [facility] 160/110  In the past, she has attributed this to her porphyria but  recently found out father had HTN (she is adopted). + known neuropathy- unchanged  Patient Active Problem List   Diagnosis Date Noted  . Pain of right shoulder joint on movement 02/08/2019  . Sprain of right acromioclavicular ligament 01/17/2019  . Pain in right hand 10/06/2018  . Elevated BP without diagnosis of hypertension 11/11/2017  . Trigger ring finger of right hand 12/16/2016  . Closed fracture of 4th metacarpal 11/13/2016  . Physical exam 10/01/2016  . Adjustment disorder with mixed anxiety and depressed mood 10/01/2016  . 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 Tobacco use 03/19/2016  . Menorrhagia 03/19/2016  . Weight gain 03/19/2016    Social History   Tobacco Use  . Smoking status: Current Every Day Smoker    Packs/day: 0.50    Years: 23.00    Pack years: 11.50    Types: Cigarettes  . Smokeless tobacco: Never Used  Substance Use Topics  . Alcohol use: Yes    Comment: OCCAS    Current Outpatient Medications:  .  acetaminophen (TYLENOL) 500 MG tablet, Take 500-1,000 mg by mouth every 6 (six) hours as needed for moderate pain., Disp: , Rfl:  .  cetirizine (ZYRTEC) 10 MG tablet, Take 10 mg by mouth daily., Disp: , Rfl:  .  cholecalciferol (VITAMIN D) 1000 units tablet, Take 1,000 Units by mouth daily., Disp: , Rfl:  .  ALPRAZolam (XANAX) 0.25 MG tablet, Take 1 tablet (0.25 mg total) by mouth 2 (two) times daily as needed for anxiety. (Patient not taking: Reported on 02/14/2020), Disp: 30 tablet, Rfl: 1 .  clindamycin (CLEOCIN) 300 MG capsule, Take 300 mg by mouth 3 (three) times daily. (Patient not taking: Reported on 02/14/2020), Disp: , Rfl:  .  escitalopram (LEXAPRO) 10 MG tablet, TAKE 1  TABLET BY MOUTH EVERY DAY (Patient not taking: Reported on 02/14/2020), Disp: 90 tablet, Rfl: 1 .  ketotifen (ZADITOR) 0.025 % ophthalmic solution, Place 1 drop into both eyes 2 (two) times daily as needed (allergies). (Patient not taking: Reported on 02/14/2020), Disp: , Rfl:  .   Olopatadine HCl 0.2 % SOLN, INSTILL 1 DROP INTO AFFECTED EYE(S) EVERY DAY (Patient not taking: Reported on 02/14/2020), Disp: , Rfl: 0 .  sertraline (ZOLOFT) 50 MG tablet, TAKE 1 TABLET BY MOUTH EVERY DAY (Patient not taking: Reported on 02/14/2020), Disp: 90 tablet, Rfl: 0 .  tetrahydrozoline 0.05 % ophthalmic solution, Apply 1 drop to eye daily. (Patient not taking: Reported on 02/14/2020), Disp: , Rfl:   Allergies  Allergen Reactions  . Codeine Nausea And Vomiting    Vomits to point of tearing stomach lining, causing bleeding   . Erythromycin Anaphylaxis, Swelling and Rash    Any "mycins" including Neomycin  . Neomycin Anaphylaxis and Swelling  . Vancomycin Other (See Comments)    Erythromycin - Anaphylaxis  . Amoxicillin Rash  . Latex Itching    Severity increases when in contact with moisture: fluid, sweat, etc.   . 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  . Propoxyphene Nausea And Vomiting  . Sulfa Antibiotics Rash and Nausea And Vomiting  . Sulfasalazine Rash  . 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 Banana Other (See Comments)    Severe muscle pain and cramps   . Hydrocodone Other (See Comments)    Because Has Variegate Porphyria, CAN CAUSE CARDIAC ARREST  . Ibuprofen Nausea And Vomiting    Can cause heart to stop secondary to Variagate porphyria Can cause heart to stop secondary to Variagate porphyria   . Meperidine Other (See Comments)    Unknown  . Other     Darvocet   . Oxytocin Itching  . Penicillins Rash    Childhood allergy Has patient had a PCN reaction causing immediate rash, facial/tongue/throat swelling, SOB or lightheadedness with hypotension: Unknown Has patient had a PCN reaction causing severe rash involving mucus membranes or skin necrosis: Unknown Has patient had a PCN reaction that required hospitalization: No Has patient had a PCN  reaction occurring within the last 10 years: No If all of the above answers are "NO", then may proceed with Cephalosporin use.     Objective:   There were no vitals taken for this visit. AAOx3, NAD NCAT, EOMI No obvious CN deficits Coloring WNL + vocal hoarseness Pt is able to speak clearly, coherently without shortness of breath or increased work of breathing.  Thought process is linear.  Mood is appropriate.   Assessment and Plan:   CPE- pt to call GYN and schedule pap and mammo.  Referral placed for colonoscopy.  UTD on immunizations.  Again encouraged her to quit smoking but she feels the nicotine is beneficial for her porphyria.  Check labs.  Anticipatory guidance provided.   Elevated BP- pt has hx of elevated BP but has always associated this w/ her porphyria.  She has recently discovered that her biological father had HTN and now she's not sure what to do.  Given that she is also having palpitations, I will refer to cardiology for a complete evaluation and tx if needed.   Neena Rhymes, MD 02/14/2020

## 2020-02-16 ENCOUNTER — Telehealth: Payer: Self-pay | Admitting: Family Medicine

## 2020-02-16 NOTE — Telephone Encounter (Signed)
Called pt to schedule a lab only visit

## 2020-03-13 ENCOUNTER — Ambulatory Visit (INDEPENDENT_AMBULATORY_CARE_PROVIDER_SITE_OTHER): Payer: BC Managed Care – PPO | Admitting: Cardiology

## 2020-03-13 ENCOUNTER — Encounter: Payer: Self-pay | Admitting: Cardiology

## 2020-03-13 ENCOUNTER — Other Ambulatory Visit: Payer: Self-pay

## 2020-03-13 VITALS — BP 203/102 | HR 121 | Ht 63.0 in | Wt 176.0 lb

## 2020-03-13 DIAGNOSIS — I10 Essential (primary) hypertension: Secondary | ICD-10-CM | POA: Diagnosis not present

## 2020-03-13 DIAGNOSIS — Z720 Tobacco use: Secondary | ICD-10-CM | POA: Diagnosis not present

## 2020-03-13 DIAGNOSIS — Z7189 Other specified counseling: Secondary | ICD-10-CM

## 2020-03-13 DIAGNOSIS — Z8249 Family history of ischemic heart disease and other diseases of the circulatory system: Secondary | ICD-10-CM

## 2020-03-13 MED ORDER — CARVEDILOL 6.25 MG PO TABS: 6.2500 mg | ORAL_TABLET | Freq: Two times a day (BID) | ORAL | 11 refills | Status: DC

## 2020-03-13 NOTE — Patient Instructions (Addendum)
Medication Instructions:  Start: Carvedilol 6.25 mg twice a day  *If you need a refill on your cardiac medications before your next appointment, please call your pharmacy*   Lab Work: None  Testing/Procedures: None   Follow-Up: At [facility], you and your health needs are our priority.  As part of our continuing mission to provide you with exceptional heart care, we have created designated Provider Care Teams.  These Care Teams include your primary Cardiologist (physician) and Advanced Practice Providers (APPs -  Physician Assistants and Nurse Practitioners) who all work together to provide you with the care you need, when you need it.  We recommend signing up for the patient portal called "MyChart".  Sign up information is provided on this After Visit Summary.  MyChart is used to connect with patients for Virtual Visits (Telemedicine).  Patients are able to view lab/test results, encounter notes, upcoming appointments, etc.  Non-urgent messages can be sent to your provider as well.   To learn more about what you can do with MyChart, go to [email or web address].    Your next appointment:   1 month(s)  The format for your next appointment:   Virtual Visit   Provider:   Jodelle Red, MD   Other Instructions -counseled on how to check blood pressure:  -sit comfortably in a chair, feet uncrossed and flat on floor, for 5-10 minutes  -arm ideally should rest at the level of the heart. However, arm should be relaxed and not tense (for example, do not hold the arm up unsupported)  -avoid exercise, caffeine, and tobacco for at least 30 minutes prior to BP reading  -don't take BP cuff reading over clothes (always place on skin directly)  -I prefer to know how well the medication is working, so I would like you to take your readings 1-2 hours after taking your blood pressure medication if possible

## 2020-03-13 NOTE — Progress Notes (Signed)
Cardiology Office Note:    Date:  03/13/2020   ID:  Wendy Evans, DOB 03-Aug-1975, MRN [id number]  PCP:  Sheliah Hatch, MD  Cardiologist:  Jodelle Red, MD  Referring MD: Sheliah Hatch, MD   CC: new patient consult for history of palpitations and elevated blood pressure.  History of Present Illness:    Wendy Evans is a 45 y.o. female with a hx of variegate porphyria, Covid infection (diagnosed 02/01/20) despite vaccination, tobacco use who is seen as a new consult at the request of Sheliah Hatch, MD for the evaluation and management of palpitations and elevated blood pressure.  Note from 02/14/2020 with Dr. Beverely Low reviewed.   Concerns today: blood pressure has been consistently rising. Wondering if this is due to the porphyria or hypertension.  Was trialed on hydroxychloroquine years ago, had significant palpitations on this. Took for about 1.5 years, had palpitations throughout. Stopped taking about 7 years ago, and palpitations stopped. However, palpitations started back about 2 years ago. Can go months in between with no events and can then have several in a week. Last about 30 seconds or less each time. No clear pattern. Discussed monitor and [employer], unlikely that we will catch it given frequency/duration.  Has never been hospitalized for acute porphyria attack. Has had 3 major attacks, resolved after several hours. Carb loads to minimize risk of symptoms.  Current every day smoker.  Blood pressures have been rising over the last several years. Hasn't checked at home, but hasn't had a regular blood pressure reading at a visit in about 2 years. Has never been on anything for blood pressure before.   -Family history: she is adopted but recently found out her father had hypertension. He passed away age 1 during surgery for aortic rupture. Biological mother had high cholesterol. Gradnmother passed away age 21, had a pig valve in her 4s that lasted until  the end of her life. Had vascular demetia and TIA history. Sister has cutaneous porphyria.  Past Medical History:  Diagnosis Date  . Allergy   . Anemia   . Anxiety   . Bronchitis 07/2016   x 3 times in her life  . Complication of anesthesia    SEIZURES BECAUSE OF ALLERGY TO CODEINE  . Depression   . Headache    cold dark room/sleep - no med  . History of chicken pox   . Hypertension    ONLY WITH REACTION TO PROPHYRIA  . Joint pain    Rare - reaction to porphyria only  . Palpitation     Rare -  only with porphyria reaction  . Porphyria variegata (HCC)   . SVD (spontaneous vaginal delivery)    x 1  . Tendonitis    Hx - no problems now    Past Surgical History:  Procedure Laterality Date  . BREAST REDUCTION SURGERY    . DILITATION & CURRETTAGE/HYSTROSCOPY WITH HYDROTHERMAL ABLATION N/A 10/02/2016   Procedure: DILATATION & CURETTAGE/HYSTEROSCOPY WITH HYDROTHERMAL ABLATION;  Surgeon: Marcelle Overlie, MD;  Location: [facility];  Service: Gynecology;  Laterality: N/A;  PT HAS HISTORY OF VARIEGATE PORPHYRIA HTA rep will be here confirmed on 09/07/16  . TYMPANOSTOMY TUBE PLACEMENT    . WISDOM TOOTH EXTRACTION      Current Medications: Current Outpatient Medications on File Prior to Visit  Medication Sig  . acetaminophen (TYLENOL) 500 MG tablet Take 500-1,000 mg by mouth every 6 (six) hours as needed for moderate pain.  . cetirizine (ZYRTEC) 10 MG tablet  Take 10 mg by mouth daily.  . cholecalciferol (VITAMIN D) 1000 units tablet Take 1,000 Units by mouth daily.   No current facility-administered medications on file prior to visit.     Allergies:   Codeine, Erythromycin, Neomycin, Vancomycin, Amoxicillin, Latex, Nsaids, Propoxyphene, Sulfa antibiotics, Sulfasalazine, Tolmetin, Banana, Hydrocodone, Ibuprofen, Meperidine, Other, Oxytocin, and Penicillins   Social History   Tobacco Use  . Smoking status: Current Every Day Smoker    Packs/day: 0.50    Years: 23.00    Pack years:  11.50    Types: Cigarettes  . Smokeless tobacco: Never Used  Vaping Use  . Vaping Use: Never used  Substance Use Topics  . Alcohol use: Yes    Comment: OCCAS  . Drug use: No    Family History: family history includes Aortic dissection in her father; Cancer in her maternal grandmother and paternal aunt; Dementia in her maternal grandmother; Diabetes in her maternal grandfather; Heart disease in her maternal grandmother; Hyperlipidemia in her maternal grandmother and mother; Hypertension in her father, maternal grandfather, and maternal grandmother.  ROS:   Please see the history of present illness.  Additional pertinent ROS: Constitutional: Negative for chills, fever, night sweats, unintentional weight loss  HENT: Negative for ear pain and hearing loss.   Eyes: Negative for loss of vision and eye pain.  Respiratory: Negative for cough, sputum, wheezing.   Cardiovascular: See HPI. Gastrointestinal: Negative for abdominal pain, melena, and hematochezia.  Genitourinary: Negative for dysuria and hematuria.  Musculoskeletal: Negative for falls and myalgias.  Skin: Negative for itching and rash.  Neurological: Negative for focal weakness, focal sensory changes and loss of consciousness.  Endo/Heme/Allergies: Exposure to light causes blood blisters    EKGs/Labs/Other Studies Reviewed:    The following studies were reviewed today: No prior cardiac studies  EKG:  EKG is personally reviewed.  The ekg ordered today demonstrates sinus tachycardia at 121 bpm  Recent Labs: No results found for requested labs within last 8760 hours.  Recent Lipid Panel    Component Value Date/Time   CHOL 195 11/11/2017 1345   TRIG 120.0 11/11/2017 1345   HDL 53.30 11/11/2017 1345   CHOLHDL 4 11/11/2017 1345   VLDL 24.0 11/11/2017 1345   LDLCALC 118 (H) 11/11/2017 1345    Physical Exam:    VS:  BP (!) 203/102   Pulse (!) 121   Ht 5\' 3"  (1.6 m)   Wt 176 lb (79.8 kg)   SpO2 100%   BMI 31.18 kg/m      Wt Readings from Last 3 Encounters:  03/13/20 176 lb (79.8 kg)  02/04/18 175 lb (79.4 kg)  11/11/17 173 lb 8 oz (78.7 kg)    GEN: Well nourished, well developed in no acute distress HEENT: Normal, moist mucous membranes NECK: No JVD CARDIAC: regular rhythm, normal S1 and S2, no rubs or gallops. No murmur. VASCULAR: Radial and DP pulses 2+ bilaterally. No carotid bruits RESPIRATORY:  Clear to auscultation without rales, wheezing or rhonchi  ABDOMEN: Soft, non-tender, non-distended MUSCULOSKELETAL:  Ambulates independently SKIN: Warm and dry, no edema NEUROLOGIC:  Alert and oriented x 3. No focal neuro deficits noted. PSYCHIATRIC:  Normal affect    ASSESSMENT:    1. Essential hypertension   2. Variegate porphyria (HCC)   3. Family history of heart disease   4. Tobacco use   5. Cardiac risk counseling   6. Counseling on health promotion and disease prevention    PLAN:    Hypertension Sinus tachycardia: -reviewed that  while porphyria can cause autonomic issues, her long term elevated BP is likely at least partially due to hypertension -reviewed recommended medications based on her history of porphyria. After shared decision making, will start with low dose carvedilol and titrate upward -she will get an arm BP cuff. Given instructions on how to measure BP  Variegate porphyria: we reviewed today. Medications need to be checked prior to prescribing to make sure they will not worsen her symptoms.  Family history of cardiovascular disease: reviewed above  Tobacco use: would recommend cessation  Cardiac risk counseling and prevention recommendations: -we typically recommend the Mediterranean diet, but she has a carb-centric diet due to her porphyria -recommend moderate walking, 3-5 times/week for 30-50 minutes each session. Aim for at least 150 minutes.week. Goal should be pace of 3 miles/hours, or walking 1.5 miles in 30 minutes -recommend avoidance of tobacco products. Avoid  excess alcohol. -ASCVD risk score: The 10-year ASCVD risk score Denman George DC Montez Hageman., et al., 2013) is: 6.3%   Values used to calculate the score:     Age: 70 years     Sex: Female     Is Non-Hispanic African American: No     Diabetic: No     Tobacco smoker: Yes     Systolic Blood Pressure: 192 mmHg     Is BP treated: No     HDL Cholesterol: 53.3 mg/dL     Total Cholesterol: 195 mg/dL    Plan for follow up: 1 month  Jodelle Red, MD, PhD, [facility] [place]  [facility]    Medication Adjustments/Labs and Tests Ordered: Current medicines are reviewed at length with the patient today.  Concerns regarding medicines are outlined above.  Orders Placed This Encounter  Procedures  . EKG 12-Lead   Meds ordered this encounter  Medications  . carvedilol (COREG) 6.25 MG tablet    Sig: Take 1 tablet (6.25 mg total) by mouth 2 (two) times daily.    Dispense:  60 tablet    Refill:  11    Patient Instructions  Medication Instructions:  Start: Carvedilol 6.25 mg twice a day  *If you need a refill on your cardiac medications before your next appointment, please call your pharmacy*   Lab Work: None  Testing/Procedures: None   Follow-Up: At [facility], you and your health needs are our priority.  As part of our continuing mission to provide you with exceptional heart care, we have created designated Provider Care Teams.  These Care Teams include your primary Cardiologist (physician) and Advanced Practice Providers (APPs -  Physician Assistants and Nurse Practitioners) who all work together to provide you with the care you need, when you need it.  We recommend signing up for the patient portal called "MyChart".  Sign up information is provided on this After Visit Summary.  MyChart is used to connect with patients for Virtual Visits (Telemedicine).  Patients are able to view lab/test results, encounter notes, upcoming appointments, etc.  Non-urgent messages can be sent to your  provider as well.   To learn more about what you can do with MyChart, go to [email or web address].    Your next appointment:   1 month(s)  The format for your next appointment:   Virtual Visit   Provider:   Jodelle Red, MD   Other Instructions -counseled on how to check blood pressure:  -sit comfortably in a chair, feet uncrossed and flat on floor, for 5-10 minutes  -arm ideally should rest at the level of the heart.  However, arm should be relaxed and not tense (for example, do not hold the arm up unsupported)  -avoid exercise, caffeine, and tobacco for at least 30 minutes prior to BP reading  -don't take BP cuff reading over clothes (always place on skin directly)  -I prefer to know how well the medication is working, so I would like you to take your readings 1-2 hours after taking your blood pressure medication if possible   Signed, Jodelle Red, MD PhD 03/13/2020    [facility]

## 2020-03-14 ENCOUNTER — Encounter: Payer: Self-pay | Admitting: Cardiology

## 2020-04-10 ENCOUNTER — Telehealth: Payer: Self-pay | Admitting: *Deleted

## 2020-04-10 NOTE — Telephone Encounter (Signed)
Called patient, no answer. Left a message for the patient to me back to discuss her cardiologist OV.

## 2020-04-10 NOTE — Telephone Encounter (Signed)
Pls call pt again. She stated that she thought that you had more questions as she also has other medical concerns that she wants to make sure that you are aware of. Pls call pt after 2:40pm.

## 2020-04-10 NOTE — Telephone Encounter (Signed)
Spoke with the patient. Patient request to cancel the colonoscopy at this time until she goes back to see the cardiologist and her BP is stable on the new BP medication. Patient will call us back to reschedule when she gets clearance from the cardiologist. I offered OV to see MD because the patient was concern with her medical hx of variegate porphyria but pt declined she states she feels comfortable having the colonoscopy using the Propofol.

## 2020-05-10 ENCOUNTER — Encounter: Payer: BC Managed Care – PPO | Admitting: Internal Medicine

## 2020-07-15 ENCOUNTER — Encounter: Payer: Self-pay | Admitting: Cardiology

## 2020-07-15 ENCOUNTER — Other Ambulatory Visit: Payer: Self-pay

## 2020-07-15 ENCOUNTER — Ambulatory Visit: Payer: BC Managed Care – PPO | Admitting: Cardiology

## 2020-07-15 VITALS — BP 174/98 | HR 73 | Ht 63.5 in | Wt 174.2 lb

## 2020-07-15 DIAGNOSIS — Z7189 Other specified counseling: Secondary | ICD-10-CM

## 2020-07-15 DIAGNOSIS — Z8249 Family history of ischemic heart disease and other diseases of the circulatory system: Secondary | ICD-10-CM | POA: Diagnosis not present

## 2020-07-15 DIAGNOSIS — I10 Essential (primary) hypertension: Secondary | ICD-10-CM

## 2020-07-15 DIAGNOSIS — Z720 Tobacco use: Secondary | ICD-10-CM | POA: Diagnosis not present

## 2020-07-15 MED ORDER — AMLODIPINE BESYLATE 5 MG PO TABS: 5.0000 mg | ORAL_TABLET | Freq: Every day | ORAL | 3 refills | Status: DC

## 2020-07-15 NOTE — Progress Notes (Signed)
Cardiology Office Note:    Date:  07/15/2020   ID:  Chanique Duca, DOB 24-Nov-1975, MRN [id number]  PCP:  Midge Minium, MD  Cardiologist:  Buford Dresser, MD  Referring MD: Midge Minium, MD   CC: follow up  History of Present Illness:    Wendy Evans is a 45 y.o. female with a hx of variegate porphyria, Covid infection (diagnosed 02/01/20) despite vaccination, tobacco use who is seen for follow up today. I initially met her 03/13/20 as a new consult at the request of Midge Minium, MD for the evaluation and management of palpitations and elevated blood pressure.  See initial note for history of her porphyria. Had never been on blood pressure medication prior to 03/2020.   Current every day smoker.  Family history: she is adopted but recently found out her father had hypertension. He passed away age 26 during surgery for aortic rupture. Biological mother had high cholesterol. Gradnmother passed away age 65, had a pig valve in her 48s that lasted until the end of her life. Had vascular demetia and TIA history. Sister has cutaneous porphyria.  Today: Tried to use home BP cuff, but doesn't seem to be working. She is very stressed today. Tolerating carvedilol, feels no different. Missed a week of medications last week, traveled for family issues. Did take it this morning.   Discussed amlodipine today. Has never been on. Discussed how it works, what to watch for. Does not appear to be contraindicated with her variegate porphyria.  Had positional chest pain in her back, pulsing, focal in her back on the left side. Related to breathing. Hurt the most when she held her breath, improved with changing position and breathing.  Has a dental procedure upcoming, needs a note that she is safe to be treated from a cardiovascular perspective. We did discuss that her blood pressure rises with stress or pain, and given her stress at the dentist, her blood pressure will be elevated.  She is cleared from a cardiovascular perspective. Sedation would likely help her blood pressure. Will fax the note to [employer],  Fax [phone].  Still smoking, precontemplative.  Denies shortness of breath at rest or with normal exertion. No PND, orthopnea, LE edema or unexpected weight gain. No syncope or palpitations.  Past Medical History:  Diagnosis Date  . Allergy   . Anemia   . Anxiety   . Bronchitis 07/2016   x 3 times in her life  . Complication of anesthesia    SEIZURES BECAUSE OF ALLERGY TO CODEINE  . Depression   . Headache    cold dark room/sleep - no med  . History of chicken pox   . Hypertension    ONLY WITH REACTION TO PROPHYRIA  . Joint pain    Rare - reaction to porphyria only  . Palpitation     Rare -  only with porphyria reaction  . Porphyria variegata ([place])   . SVD (spontaneous vaginal delivery)    x 1  . Tendonitis    Hx - no problems now    Past Surgical History:  Procedure Laterality Date  . BREAST REDUCTION SURGERY    . DILITATION & CURRETTAGE/HYSTROSCOPY WITH HYDROTHERMAL ABLATION N/A 10/02/2016   Procedure: DILATATION & CURETTAGE/HYSTEROSCOPY WITH HYDROTHERMAL ABLATION;  Surgeon: Dian Queen, MD;  Location: [facility];  Service: Gynecology;  Laterality: N/A;  PT HAS HISTORY OF VARIEGATE PORPHYRIA HTA rep will be here confirmed on 09/07/16  . TYMPANOSTOMY TUBE PLACEMENT    .  WISDOM TOOTH EXTRACTION      Current Medications: Current Outpatient Medications on File Prior to Visit  Medication Sig  . acetaminophen (TYLENOL) 500 MG tablet Take 500-1,000 mg by mouth every 6 (six) hours as needed for moderate pain.  . carvedilol (COREG) 6.25 MG tablet Take 1 tablet (6.25 mg total) by mouth 2 (two) times daily.  . cetirizine (ZYRTEC) 10 MG tablet Take 10 mg by mouth daily.  . cholecalciferol (VITAMIN D) 1000 units tablet Take 1,000 Units by mouth daily.  . fluticasone (FLONASE) 50 MCG/ACT nasal spray Place 1 spray into both nostrils  every other day.  . melatonin 5 MG TABS Take 5 mg by mouth 2 (two) times a week.   No current facility-administered medications on file prior to visit.     Allergies:   Codeine, Erythromycin, Neomycin, Vancomycin, Amoxicillin, Latex, Nsaids, Propoxyphene, Sulfa antibiotics, Sulfasalazine, Tolmetin, Banana, Hydrocodone, Ibuprofen, Meperidine, Other, Oxytocin, and Penicillins   Social History   Tobacco Use  . Smoking status: Current Every Day Smoker    Packs/day: 0.50    Years: 23.00    Pack years: 11.50    Types: Cigarettes  . Smokeless tobacco: Never Used  Vaping Use  . Vaping Use: Never used  Substance Use Topics  . Alcohol use: Yes    Comment: OCCAS  . Drug use: No    Family History: family history includes Aortic dissection in her father; Cancer in her maternal grandmother and paternal aunt; Dementia in her maternal grandmother; Diabetes in her maternal grandfather; Heart disease in her maternal grandmother; Hyperlipidemia in her maternal grandmother and mother; Hypertension in her father, maternal grandfather, and maternal grandmother.  ROS:   Please see the history of present illness.  Additional pertinent ROS otherwise unremarkable.   EKGs/Labs/Other Studies Reviewed:    The following studies were reviewed today: No prior cardiac studies  EKG:  EKG is personally reviewed.  The ekg ordered today demonstrates NSR at 73 bpm.  Recent Labs: No results found for requested labs within last 8760 hours.  Recent Lipid Panel    Component Value Date/Time   CHOL 195 11/11/2017 1345   TRIG 120.0 11/11/2017 1345   HDL 53.30 11/11/2017 1345   CHOLHDL 4 11/11/2017 1345   VLDL 24.0 11/11/2017 1345   LDLCALC 118 (H) 11/11/2017 1345    Physical Exam:    VS:  BP (!) 174/98 (BP Location: Left Arm, Patient Position: Sitting, Cuff Size: Normal)   Pulse 73   Ht 5' 3.5" (1.613 m)   Wt 174 lb 3.2 oz (79 kg)   SpO2 97%   BMI 30.37 kg/m     Wt Readings from Last 3 Encounters:   07/15/20 174 lb 3.2 oz (79 kg)  03/13/20 176 lb (79.8 kg)  02/04/18 175 lb (79.4 kg)    GEN: Well nourished, well developed in no acute distress HEENT: Normal, moist mucous membranes NECK: No JVD CARDIAC: regular rhythm, normal S1 and S2, no rubs or gallops. No murmur. VASCULAR: Radial and DP pulses 2+ bilaterally. No carotid bruits RESPIRATORY:  Clear to auscultation without rales, wheezing or rhonchi  ABDOMEN: Soft, non-tender, non-distended MUSCULOSKELETAL:  Ambulates independently SKIN: Warm and dry, no edema NEUROLOGIC:  Alert and oriented x 3. No focal neuro deficits noted. PSYCHIATRIC:  Normal affect   ASSESSMENT:    1. Essential hypertension   2. Variegate porphyria ([place])   3. Family history of heart disease   4. Tobacco use   5. Cardiac risk counseling   6.  Counseling on health promotion and disease prevention    PLAN:    Hypertension -reviewed that while porphyria can cause autonomic issues, her long term elevated BP is likely at least partially due to hypertension -initial BP very elevated, recheck improved but still high. Asymptomatic -will add amlodipine 5 mg daily. Will arrange for PharmD follow up in 4-6 weeks. If tolerating amlodipine, but not at goal, would increase to 10 mg daily. Alternatively, can increase carvedilol, but would make only one change at a time. -has arm BP cuff. Concern it is not working well. Will bring to PharmD appt.  Sinus tachycardia: -now sinus rhythm in the 70s, likely 2/2 carvedilol  Variegate porphyria: we reviewed today. Medications need to be checked prior to prescribing to make sure they will not worsen her symptoms.  Family history of cardiovascular disease: reviewed above  Tobacco use: would recommend cessation, precontemplative  Cardiac risk counseling and prevention recommendations: -we typically recommend the Mediterranean diet, but she has a carb-centric diet due to her porphyria -recommend moderate walking, 3-5  times/week for 30-50 minutes each session. Aim for at least 150 minutes.week. Goal should be pace of 3 miles/hours, or walking 1.5 miles in 30 minutes -recommend avoidance of tobacco products. Avoid excess alcohol. -ASCVD risk score: The 10-year ASCVD risk score Mikey Bussing DC Brooke Bonito., et al., 2013) is: 7.5%   Values used to calculate the score:     Age: 1 years     Sex: Female     Is Non-Hispanic African American: No     Diabetic: No     Tobacco smoker: Yes     Systolic Blood Pressure: 518 mmHg     Is BP treated: Yes     HDL Cholesterol: 53.3 mg/dL     Total Cholesterol: 195 mg/dL    Plan for follow up: 4-6 weeks with PharmD, then 3 mos with me to continue to titrate medications.  Buford Dresser, MD, PhD, [facility]    Medication Adjustments/Labs and Tests Ordered: Current medicines are reviewed at length with the patient today.  Concerns regarding medicines are outlined above.  Orders Placed This Encounter  Procedures  . EKG 12-Lead   Meds ordered this encounter  Medications  . amLODipine (NORVASC) 5 MG tablet    Sig: Take 1 tablet (5 mg total) by mouth daily.    Dispense:  90 tablet    Refill:  3    Patient Instructions  Medication Instructions:   START: Amlodipine 5 mg daily   *If you need a refill on your cardiac medications before your next appointment, please call your pharmacy*   Lab Work: None ordered today   Testing/Procedures: None ordered today   Follow-Up: At [facility], you and your health needs are our priority.  As part of our continuing mission to provide you with exceptional heart care, we have created designated Provider Care Teams.  These Care Teams include your primary Cardiologist (physician) and Advanced Practice Providers (APPs -  Physician Assistants and Nurse Practitioners) who all work together to provide you with the care you need, when you need it.  We recommend signing up for the patient portal called  "MyChart".  Sign up information is provided on this After Visit Summary.  MyChart is used to connect with patients for Virtual Visits (Telemedicine).  Patients are able to view lab/test results, encounter notes, upcoming appointments, etc.  Non-urgent messages can be sent to your provider as well.   To learn more about what  you can do with MyChart, go to [email or web address].    Your next appointment:   3 month(s) @ [address] [place], [place] [address]   The format for your next appointment:   In Person  Provider:   Buford Dresser, MD  Your physician recommends that you schedule a follow-up appointment in 4-6 weeks with Pharm D.      Signed, Buford Dresser, MD PhD 07/15/2020    [facility]

## 2020-07-15 NOTE — Patient Instructions (Signed)
Medication Instructions:   START: Amlodipine 5 mg daily   *If you need a refill on your cardiac medications before your next appointment, please call your pharmacy*   Lab Work: None ordered today   Testing/Procedures: None ordered today   Follow-Up: At [facility], you and your health needs are our priority.  As part of our continuing mission to provide you with exceptional heart care, we have created designated Provider Care Teams.  These Care Teams include your primary Cardiologist (physician) and Advanced Practice Providers (APPs -  Physician Assistants and Nurse Practitioners) who all work together to provide you with the care you need, when you need it.  We recommend signing up for the patient portal called "MyChart".  Sign up information is provided on this After Visit Summary.  MyChart is used to connect with patients for Virtual Visits (Telemedicine).  Patients are able to view lab/test results, encounter notes, upcoming appointments, etc.  Non-urgent messages can be sent to your provider as well.   To learn more about what you can do with MyChart, go to [email or web address].    Your next appointment:   3 month(s) @ [address] [place], [place] [address]   The format for your next appointment:   In Person  Provider:   Jodelle Red, MD  Your physician recommends that you schedule a follow-up appointment in 4-6 weeks with Pharm D.

## 2020-07-23 DIAGNOSIS — Z20822 Contact with and (suspected) exposure to covid-19: Secondary | ICD-10-CM | POA: Diagnosis not present

## 2020-07-23 DIAGNOSIS — J020 Streptococcal pharyngitis: Secondary | ICD-10-CM | POA: Diagnosis not present

## 2020-08-05 ENCOUNTER — Telehealth: Payer: Self-pay

## 2020-08-05 DIAGNOSIS — R635 Abnormal weight gain: Secondary | ICD-10-CM

## 2020-08-05 DIAGNOSIS — R030 Elevated blood-pressure reading, without diagnosis of hypertension: Secondary | ICD-10-CM

## 2020-08-05 NOTE — Telephone Encounter (Signed)
Lmom pt to complete labs prior to appt

## 2020-08-07 ENCOUNTER — Encounter: Payer: Self-pay | Admitting: *Deleted

## 2020-08-19 ENCOUNTER — Ambulatory Visit: Payer: BC Managed Care – PPO

## 2020-09-02 DIAGNOSIS — M25512 Pain in left shoulder: Secondary | ICD-10-CM | POA: Diagnosis not present

## 2020-09-07 NOTE — Progress Notes (Signed)
09/12/2020 Wendy Evans 11-04-1975 [id number]   HPI:  Wendy Evans is a 45 y.o. female patient of Dr Rondall Allegra, who presents today for hypertension clinic evaluation.  In addition to hypertension, her medical history is significant for variegate porphyria.  This causes [place] to be somewhat limited on medications that she can use without causing her to have flare-ups.  She appears to have had increasing blood pressures over the last couple of years, but never given any medications to treat this until Dr. Cristal Deer started carvedilol in February.  In June she was seen again and pressure remained quite elevated.  Amlodipine 5 mg daily was added at that time.      Today she is in the office for follow up. Her blood pressure remains uncontrolled and a review of the database from the [employer] Porphy[employer] suggests that ARBs are a good choice as well as carvedilol.  Amlodipine is thought to be good - it come with a rating of OK? As opposed to OK, while diltiazem/verapamil are contraindicated.   Patient tolerating amlodipine and carvedilol without any issues.     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 Dew and coffee, unsweet tea  Diet: mostly sit down restaurnt food, 4-5 days per week; no deep fried foods ; feels that carb loads help with porphyria; loves salads  Exercise: walks daily on the hour at work  Home BP readings: none   Intolerances: multiple - more related to meds that should be avoided with porphyria  Labs: 02/2018:  Na 139, K 3.9, Glu 92, BUN 12, SCr 0.61   Wt Readings from Last 3 Encounters:  09/09/20 177 lb (80.3 kg)  07/15/20 174 lb 3.2 oz (79 kg)  03/13/20 176 lb (79.8 kg)   BP Readings from Last 3 Encounters:  09/09/20  (!) 222/130  07/15/20 (!) 174/98  03/13/20 (!) 203/102   Pulse Readings from Last 3 Encounters:  09/09/20 (!) 103  07/15/20 73  03/13/20 (!) 121    Current Outpatient Medications  Medication Sig Dispense Refill   acetaminophen (TYLENOL) 500 MG tablet Take 500-1,000 mg by mouth every 6 (six) hours as needed for moderate pain.     amLODipine (NORVASC) 5 MG tablet Take 1 tablet (5 mg total) by mouth daily. 90 tablet 3   carvedilol (COREG) 12.5 MG tablet Take 1 tablet (12.5 mg total) by mouth 2 (two) times daily. 180 tablet 3   cetirizine (ZYRTEC) 10 MG tablet Take 10 mg by mouth daily.     cholecalciferol (VITAMIN D) 1000 units tablet Take 1,000 Units by mouth daily.     fluticasone (FLONASE) 50 MCG/ACT nasal spray Place 1 spray into both nostrils every other day.     melatonin 5 MG TABS Take 5 mg by mouth 2 (two) times a week.     methocarbamol (ROBAXIN) 500 MG tablet Take 500-1,000 mg by mouth every 6 (six) hours as needed.     No current facility-administered medications for this visit.    Allergies  Allergen Reactions   Codeine Nausea And Vomiting    Vomits to point of tearing stomach lining, causing bleeding    Erythromycin Anaphylaxis and Swelling    Any "mycins" including Neomycin.    Neomycin Anaphylaxis and Swelling   Vancomycin Other (  See Comments)    Erythromycin - Anaphylaxis   Amoxicillin Rash   Latex Itching    Severity increases when in contact with moisture: fluid, sweat, etc.    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    Banana Other (See Comments)    Severe muscle pain and cramps    Hydrocodone Other (See Comments)    Because Has Variegate Porphyria, CAN CAUSE CARDIAC ARREST   Ibuprofen Nausea And Vomiting    Has  Variagate porphyria can not use     Meperidine Other (See Comments)    Unknown   Other     Darvocet    Oxytocin Itching   Penicillins Rash    Childhood allergy Has patient had a PCN reaction causing immediate rash, facial/tongue/throat swelling, SOB or lightheadedness with hypotension: Unknown Has patient had a PCN reaction causing severe rash involving mucus membranes or skin necrosis: Unknown Has patient had a PCN reaction that required hospitalization: No Has patient had a PCN reaction occurring within the last 10 years: No If all of the above answers are "NO", then may proceed with Cephalosporin use.     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 rate 14, height 5\' 4"  (1.626 m), weight 177 lb (80.3 kg), SpO2 99 %.  Hypertension Patient with essential hypertension not well controlled.  She is hopeful that we can get her pressure controlled and then use a prn medication for BP spikes related to the porphyria.   Will have her increase the carvedilol to 12.5 mg twice daily and continue amlodipine for now.  Would like for her to monitor home BP readings, however she states home cuff has not been working.  Asked that she bring her device into the office this coming Friday afternoon and we can determine accuracy/technique.  She is scheduled to see Dr. Tuesday in a month, and we can see her after that should her pressure not be at goal.      Cristal Deer PharmD CPP [facility] [address] [place], [place] [phone]

## 2020-09-09 ENCOUNTER — Ambulatory Visit (INDEPENDENT_AMBULATORY_CARE_PROVIDER_SITE_OTHER): Payer: BC Managed Care – PPO | Admitting: Pharmacist Clinician (PhC)/ Clinical Pharmacy Specialist

## 2020-09-09 ENCOUNTER — Other Ambulatory Visit: Payer: Self-pay

## 2020-09-09 DIAGNOSIS — I10 Essential (primary) hypertension: Secondary | ICD-10-CM | POA: Diagnosis not present

## 2020-09-09 MED ORDER — CARVEDILOL 12.5 MG PO TABS: 12.5000 mg | ORAL_TABLET | Freq: Two times a day (BID) | ORAL | 3 refills | Status: DC

## 2020-09-09 NOTE — Patient Instructions (Addendum)
Return Friday at 4 pm with your home BP device so we can check it for accuracy  Check your blood pressure at home daily and keep record of the readings.  Take your BP meds as follows:  Increase carvedilol to 12.5 mg twice daily  Continue amlodipine 5 mg daily  Bring all of your meds, your BP cuff and your record of home blood pressures to your next appointment.  Exercise as you're able, try to walk approximately 30 minutes per day.  Keep salt intake to a minimum, especially watch canned and prepared boxed foods.  Eat more fresh fruits and vegetables and fewer canned items.  Avoid eating in fast food restaurants.    HOW TO TAKE YOUR BLOOD PRESSURE: Rest 5 minutes before taking your blood pressure.  Don't smoke or drink caffeinated beverages for at least 30 minutes before. Take your blood pressure before (not after) you eat. Sit comfortably with your back supported and both feet on the floor (don't cross your legs). Elevate your arm to heart level on a table or a desk. Use the proper sized cuff. It should fit smoothly and snugly around your bare upper arm. There should be enough room to slip a fingertip under the cuff. The bottom edge of the cuff should be 1 inch above the crease of the elbow. Ideally, take 3 measurements at one sitting and record the average.

## 2020-09-12 ENCOUNTER — Encounter: Payer: Self-pay | Admitting: Pharmacist Clinician (PhC)/ Clinical Pharmacy Specialist

## 2020-09-12 DIAGNOSIS — I10 Essential (primary) hypertension: Secondary | ICD-10-CM | POA: Insufficient documentation

## 2020-09-12 NOTE — Assessment & Plan Note (Addendum)
Patient with essential hypertension not well controlled.  She is hopeful that we can get her pressure controlled and then use a prn medication for BP spikes related to the porphyria.   Will have her increase the carvedilol to 12.5 mg twice daily and continue amlodipine for now.  Would like for her to monitor home BP readings, however she states home cuff has not been working.  Asked that she bring her device into the office this coming Friday afternoon and we can determine accuracy/technique.  She is scheduled to see Dr. Cristal Deer in a month, and we can see her after that should her pressure not be at goal.

## 2020-09-25 DIAGNOSIS — I10 Essential (primary) hypertension: Secondary | ICD-10-CM | POA: Diagnosis not present

## 2020-09-26 DIAGNOSIS — I10 Essential (primary) hypertension: Secondary | ICD-10-CM | POA: Diagnosis not present

## 2020-10-03 DIAGNOSIS — I10 Essential (primary) hypertension: Secondary | ICD-10-CM | POA: Diagnosis not present

## 2020-10-16 ENCOUNTER — Encounter (HOSPITAL_BASED_OUTPATIENT_CLINIC_OR_DEPARTMENT_OTHER): Payer: Self-pay | Admitting: Cardiology

## 2020-10-16 ENCOUNTER — Ambulatory Visit (HOSPITAL_BASED_OUTPATIENT_CLINIC_OR_DEPARTMENT_OTHER): Payer: BC Managed Care – PPO | Admitting: Cardiology

## 2020-10-16 ENCOUNTER — Other Ambulatory Visit: Payer: Self-pay

## 2020-10-16 VITALS — BP 124/84 | HR 80 | Ht 63.5 in | Wt 176.0 lb

## 2020-10-16 DIAGNOSIS — Z720 Tobacco use: Secondary | ICD-10-CM

## 2020-10-16 DIAGNOSIS — Z8249 Family history of ischemic heart disease and other diseases of the circulatory system: Secondary | ICD-10-CM

## 2020-10-16 DIAGNOSIS — I10 Essential (primary) hypertension: Secondary | ICD-10-CM | POA: Diagnosis not present

## 2020-10-16 DIAGNOSIS — Z7189 Other specified counseling: Secondary | ICD-10-CM

## 2020-10-16 NOTE — Patient Instructions (Signed)

## 2020-10-16 NOTE — Progress Notes (Signed)
Cardiology Office Note:    Date:  10/16/2020   ID:  Wendy Evans, DOB 1975/04/24, MRN [id number]  PCP:  Midge Minium, MD  Cardiologist:  Buford Dresser, MD  Referring MD: Midge Minium, MD   CC: follow up  History of Present Illness:    Wendy Evans is a 45 y.o. female with a hx of variegate porphyria, Covid infection (diagnosed 02/01/20) despite vaccination, tobacco use who is seen for follow up today. I initially met her 03/13/20 as a new consult at the request of Midge Minium, MD for the evaluation and management of palpitations and elevated blood pressure.  See initial note for history of her porphyria. Had never been on blood pressure medication prior to 03/2020.   Current every day smoker.  Family history: she is adopted but recently found out her father had hypertension. He passed away age 72 during surgery for aortic rupture. Biological mother had high cholesterol. Gradnmother passed away age 39, had a pig valve in her 8s that lasted until the end of her life. Had vascular demetia and TIA history. Sister has cutaneous porphyria.  Today: Reviewed visit with PharmD 09/09/20. BP at that visit was 222/130. Carvedilol was increased to 12.5 mg BID.   She notes that her BP readings on her home cuff vary. Reviewed readings, range 120-140/60-100. Today BP by ear was 124/84, by machine was 160/93. At home this AM was 141/91.  Tolerating carvedilol, did make her aware of her heart for the first week or so, then improved.   Had renal artery duplex done at [facility], noted elevated velocity/upper limits of normal in mid right renal artery. Has follow up CT scheduled.  Also noted hepatic cyst on imaging. She is concerned about these findings.   Still smoking, feels like she needs something to suppress the autoimmune piece before she quits.   Denies chest pain, shortness of breath at rest or with normal exertion. No PND, orthopnea, LE edema or unexpected weight gain. No  syncope, has periodic brief palpitations.   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[place])    SVD (spontaneous vaginal delivery)    x 1   Tendonitis    Hx - no problems now    Past Surgical History:  Procedure Laterality Date   BREAST REDUCTION SURGERY     DILITATION & CURRETTAGE/HYSTROSCOPY WITH HYDROTHERMAL ABLATION N/A 10/02/2016   Procedure: DILATATION & CURETTAGE/HYSTEROSCOPY WITH HYDROTHERMAL ABLATION;  Surgeon: Dian Queen, MD;  Location: [facility];  Service: Gynecology;  Laterality: N/A;  PT HAS HISTORY OF VARIEGATE PORPHYRIA HTA rep will be here confirmed on 09/07/16   TYMPANOSTOMY TUBE PLACEMENT     WISDOM TOOTH EXTRACTION      Current Medications: Current Outpatient Medications on File Prior to Visit  Medication Sig   acetaminophen (TYLENOL) 500 MG tablet Take 500-1,000 mg by mouth every 6 (six) hours as needed for moderate pain.  amLODipine (NORVASC) 5 MG tablet Take 1 tablet (5 mg total) by mouth daily.   carvedilol (COREG) 12.5 MG tablet Take 1 tablet (12.5 mg total) by mouth 2 (two) times daily.   cetirizine (ZYRTEC) 10 MG tablet Take 10 mg by mouth daily.   cholecalciferol (VITAMIN D) 1000 units tablet Take 1,000 Units by mouth daily.   fluticasone (FLONASE) 50 MCG/ACT nasal spray Place 1 spray into both nostrils every other day.   melatonin 5 MG TABS Take 5 mg by mouth 2 (two) times a week.   methocarbamol (ROBAXIN) 500 MG tablet Take 500-1,000 mg by mouth every 6 (six) hours as needed.   No current facility-administered medications on file prior to  visit.     Allergies:   Codeine, Erythromycin, Neomycin, Vancomycin, Amoxicillin, Latex, Nsaids, Propoxyphene, Sulfa antibiotics, Sulfasalazine, Tolmetin, Banana, Hydrocodone, Ibuprofen, Meperidine, Other, Oxytocin, and Penicillins   Social History   Tobacco Use   Smoking status: Every Day    Packs/day: 0.50    Years: 23.00    Pack years: 11.50    Types: Cigarettes   Smokeless tobacco: Never  Vaping Use   Vaping Use: Never used  Substance Use Topics   Alcohol use: Yes    Comment: OCCAS   Drug use: No    Family History: family history includes Aortic dissection in her father; Cancer in her maternal grandmother and paternal aunt; Dementia in her maternal grandmother; Diabetes in her maternal grandfather; Heart disease in her maternal grandmother; Hyperlipidemia in her maternal grandmother and mother; Hypertension in her father, maternal grandfather, and maternal grandmother.  ROS:   Please see the history of present illness.  Additional pertinent ROS otherwise unremarkable.   EKGs/Labs/Other Studies Reviewed:    The following studies were reviewed today: No prior cardiac studies in our system  EKG:  EKG is personally reviewed.   07/16/20 NSR at 73 bpm.  Recent Labs: No results found for requested labs within last 8760 hours.  Recent Lipid Panel    Component Value Date/Time   CHOL 195 11/11/2017 1345   TRIG 120.0 11/11/2017 1345   HDL 53.30 11/11/2017 1345   CHOLHDL 4 11/11/2017 1345   VLDL 24.0 11/11/2017 1345   LDLCALC 118 (H) 11/11/2017 1345    Physical Exam:    VS:  BP 124/84   Pulse 80   Ht 5' 3.5" (1.613 m)   Wt 176 lb (79.8 kg)   SpO2 99%   BMI 30.69 kg/m     Wt Readings from Last 3 Encounters:  10/16/20 176 lb (79.8 kg)  09/09/20 177 lb (80.3 kg)  07/15/20 174 lb 3.2 oz (79 kg)    GEN: Well nourished, well developed in no acute distress HEENT: Normal, moist mucous membranes NECK: No JVD CARDIAC: regular rhythm, normal S1 and S2, no rubs or  gallops. No murmur. VASCULAR: Radial and DP pulses 2+ bilaterally. No carotid bruits RESPIRATORY:  Clear to auscultation without rales, wheezing or rhonchi  ABDOMEN: Soft, non-tender, non-distended MUSCULOSKELETAL:  Ambulates independently SKIN: Warm and dry, no edema NEUROLOGIC:  Alert and oriented x 3. No focal neuro deficits noted. PSYCHIATRIC:  Normal affect    ASSESSMENT:    1. Primary hypertension   2. Variegate porphyria ([place])   3. Family history of heart disease   4. Tobacco use   5. Cardiac risk counseling   6. Counseling on health promotion and disease prevention     PLAN:    Hypertension -reviewed that while porphyria can cause autonomic issues, her  long term elevated BP is likely at least partially due to hypertension -dopplers at [facility] with possible RAS on the right, has CT pending -tolerating amlodipine and carvedilol, blood pressure better controlled on current dosing, continue  Sinus tachycardia: -now sinus rhythm in the 80s, likely 2/2 carvedilol  Variegate porphyria: we reviewed today. Medications need to be checked prior to prescribing to make sure they will not worsen her symptoms. [place] list most accurate per patient.  Family history of cardiovascular disease: reviewed above  Tobacco use: would recommend cessation, precontemplative. Concerned about potential lack of autoimmune suppression if she quits.  Cardiac risk counseling and prevention recommendations: -we typically recommend the Mediterranean diet, but she has a carb-centric diet due to her porphyria -recommend moderate walking, 3-5 times/week for 30-50 minutes each session. Aim for at least 150 minutes.week. Goal should be pace of 3 miles/hours, or walking 1.5 miles in 30 minutes -recommend avoidance of tobacco products. Avoid excess alcohol. -ASCVD risk score: The 10-year ASCVD risk score Mikey Bussing DC Brooke Bonito., et al., 2013) is: 5.3%   Values used to calculate the score:     Age: 37 years     Sex:  Female     Is Non-Hispanic African American: No     Diabetic: No     Tobacco smoker: Yes     Systolic Blood Pressure: 270 mmHg     Is BP treated: Yes     HDL Cholesterol: 41 MG/DL     Total Cholesterol: 189 MG/DL    Plan for follow up: 6 mos  Buford Dresser, MD, PhD, [facility]    Medication Adjustments/Labs and Tests Ordered: Current medicines are reviewed at length with the patient today.  Concerns regarding medicines are outlined above.  No orders of the defined types were placed in this encounter.  No orders of the defined types were placed in this encounter.   Patient Instructions  Medication Instructions:  Your Physician recommend you continue on your current medication as directed.    *If you need a refill on your cardiac medications before your next appointment, please call your pharmacy*   Lab Work: None ordered today   Testing/Procedures: None ordered today   Follow-Up: At [facility], you and your health needs are our priority.  As part of our continuing mission to provide you with exceptional heart care, we have created designated Provider Care Teams.  These Care Teams include your primary Cardiologist (physician) and Advanced Practice Providers (APPs -  Physician Assistants and Nurse Practitioners) who all work together to provide you with the care you need, when you need it.  We recommend signing up for the patient portal called "MyChart".  Sign up information is provided on this After Visit Summary.  MyChart is used to connect with patients for Virtual Visits (Telemedicine).  Patients are able to view lab/test results, encounter notes, upcoming appointments, etc.  Non-urgent messages can be sent to your provider as well.   To learn more about what you can do with MyChart, go to [email or web address].    Your next appointment:   6 month(s)  The format for your next appointment:   In Person  Provider:   Buford Dresser, MD     Signed, Buford Dresser, MD PhD 10/16/2020    [place]

## 2020-10-25 DIAGNOSIS — U071 COVID-19: Secondary | ICD-10-CM | POA: Diagnosis not present

## 2020-11-18 ENCOUNTER — Telehealth: Payer: Self-pay | Admitting: Family Medicine

## 2020-11-18 NOTE — Telephone Encounter (Signed)
..  Caller name:  Wendy Evans  On DPR? :yes/no: Yes  Call back number:  [phone]  Provider they see: Guss Bunde  Reason for call:  Patient would like to know if the non preservative flu shots have arrived yet?  Please advise

## 2020-11-18 NOTE — Telephone Encounter (Signed)
LM making pt aware we have the non-preservative flu shots and she can call to make her an appt when she is ready

## 2020-11-22 ENCOUNTER — Other Ambulatory Visit: Payer: Self-pay

## 2020-11-22 ENCOUNTER — Ambulatory Visit (INDEPENDENT_AMBULATORY_CARE_PROVIDER_SITE_OTHER): Payer: BC Managed Care – PPO | Admitting: Family Medicine

## 2020-11-22 DIAGNOSIS — Z23 Encounter for immunization: Secondary | ICD-10-CM

## 2020-12-05 DIAGNOSIS — Z1231 Encounter for screening mammogram for malignant neoplasm of breast: Secondary | ICD-10-CM | POA: Diagnosis not present

## 2020-12-05 DIAGNOSIS — I701 Atherosclerosis of renal artery: Secondary | ICD-10-CM | POA: Diagnosis not present

## 2020-12-05 LAB — HM MAMMOGRAPHY

## 2020-12-11 ENCOUNTER — Encounter: Payer: Self-pay | Admitting: Family Medicine

## 2020-12-12 ENCOUNTER — Encounter (HOSPITAL_BASED_OUTPATIENT_CLINIC_OR_DEPARTMENT_OTHER): Payer: Self-pay

## 2020-12-18 DIAGNOSIS — M9903 Segmental and somatic dysfunction of lumbar region: Secondary | ICD-10-CM | POA: Diagnosis not present

## 2020-12-18 DIAGNOSIS — M5136 Other intervertebral disc degeneration, lumbar region: Secondary | ICD-10-CM | POA: Diagnosis not present

## 2020-12-25 DIAGNOSIS — M9903 Segmental and somatic dysfunction of lumbar region: Secondary | ICD-10-CM | POA: Diagnosis not present

## 2020-12-25 DIAGNOSIS — M5136 Other intervertebral disc degeneration, lumbar region: Secondary | ICD-10-CM | POA: Diagnosis not present

## 2021-01-01 DIAGNOSIS — M5136 Other intervertebral disc degeneration, lumbar region: Secondary | ICD-10-CM | POA: Diagnosis not present

## 2021-01-01 DIAGNOSIS — M9903 Segmental and somatic dysfunction of lumbar region: Secondary | ICD-10-CM | POA: Diagnosis not present

## 2021-01-08 DIAGNOSIS — M5136 Other intervertebral disc degeneration, lumbar region: Secondary | ICD-10-CM | POA: Diagnosis not present

## 2021-01-08 DIAGNOSIS — M9903 Segmental and somatic dysfunction of lumbar region: Secondary | ICD-10-CM | POA: Diagnosis not present

## 2021-01-15 DIAGNOSIS — M9903 Segmental and somatic dysfunction of lumbar region: Secondary | ICD-10-CM | POA: Diagnosis not present

## 2021-01-15 DIAGNOSIS — M5136 Other intervertebral disc degeneration, lumbar region: Secondary | ICD-10-CM | POA: Diagnosis not present

## 2021-01-22 DIAGNOSIS — M5136 Other intervertebral disc degeneration, lumbar region: Secondary | ICD-10-CM | POA: Diagnosis not present

## 2021-01-22 DIAGNOSIS — M9903 Segmental and somatic dysfunction of lumbar region: Secondary | ICD-10-CM | POA: Diagnosis not present

## 2021-01-28 DIAGNOSIS — M9903 Segmental and somatic dysfunction of lumbar region: Secondary | ICD-10-CM | POA: Diagnosis not present

## 2021-01-28 DIAGNOSIS — M5136 Other intervertebral disc degeneration, lumbar region: Secondary | ICD-10-CM | POA: Diagnosis not present

## 2021-02-19 DIAGNOSIS — M9903 Segmental and somatic dysfunction of lumbar region: Secondary | ICD-10-CM | POA: Diagnosis not present

## 2021-02-19 DIAGNOSIS — M5136 Other intervertebral disc degeneration, lumbar region: Secondary | ICD-10-CM | POA: Diagnosis not present

## 2021-03-19 DIAGNOSIS — M9903 Segmental and somatic dysfunction of lumbar region: Secondary | ICD-10-CM | POA: Diagnosis not present

## 2021-03-19 DIAGNOSIS — M5136 Other intervertebral disc degeneration, lumbar region: Secondary | ICD-10-CM | POA: Diagnosis not present

## 2021-04-15 ENCOUNTER — Other Ambulatory Visit: Payer: Self-pay

## 2021-04-15 ENCOUNTER — Ambulatory Visit (INDEPENDENT_AMBULATORY_CARE_PROVIDER_SITE_OTHER): Payer: BC Managed Care – PPO | Admitting: Cardiology

## 2021-04-15 ENCOUNTER — Encounter (HOSPITAL_BASED_OUTPATIENT_CLINIC_OR_DEPARTMENT_OTHER): Payer: Self-pay | Admitting: Cardiology

## 2021-04-15 VITALS — BP 146/96 | HR 68 | Ht 63.5 in | Wt 183.4 lb

## 2021-04-15 DIAGNOSIS — I701 Atherosclerosis of renal artery: Secondary | ICD-10-CM

## 2021-04-15 DIAGNOSIS — I773 Arterial fibromuscular dysplasia: Secondary | ICD-10-CM

## 2021-04-15 DIAGNOSIS — Z79899 Other long term (current) drug therapy: Secondary | ICD-10-CM

## 2021-04-15 DIAGNOSIS — I10 Essential (primary) hypertension: Secondary | ICD-10-CM | POA: Diagnosis not present

## 2021-04-15 MED ORDER — VALSARTAN 40 MG PO TABS
40.0000 mg | ORAL_TABLET | Freq: Every day | ORAL | 3 refills | Status: DC
Start: 2021-04-15 — End: 2021-07-16

## 2021-04-15 NOTE — Progress Notes (Signed)
Cardiology Office Note:    Date:  04/17/2021   ID:  Wendy Evans, DOB Jan 17, 1976, MRN [id number]  PCP:  Midge Minium, MD  Cardiologist:  Buford Dresser, MD  Referring MD: Midge Minium, MD   CC: follow up  History of Present Illness:    Wendy Evans is a 46 y.o. female with a hx of variegate porphyria, Covid infection (diagnosed 02/01/20) despite vaccination, tobacco use who is seen for follow up today. I initially met her 03/13/20 as a new consult at the request of Midge Minium, MD for the evaluation and management of palpitations and elevated blood pressure.  See initial note for history of her porphyria. Had never been on blood pressure medication prior to 03/2020.   Current every day smoker.  Family history: she is adopted but recently found out her father had hypertension. He passed away age 62 during surgery for aortic rupture. Biological mother had high cholesterol. Gradnmother passed away age 86, had a pig valve in her 18s that lasted until the end of her life. Had vascular demetia and TIA history. Sister has cutaneous porphyria.  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 She has not been able to exercise or have sex because of the stress. She has been trying to read studies relating to treatment and how various options affecting women.   She checks her blood pressure at home every few days. Her most recent measurement this morning was 140/93. This measurement is normal for her. Recently, she has not noticed any episodes of low blood pressure. Of note, she has found that alcohol would lower her blood pressure and would occasionally have an alcoholic drink prior to sexual activity.  She reports forgetting to take her morning dose of medications, especially her carvedilol. In the past, she has tried to bring her medications to work but will still forget to take them until the  afternoon. However, she does regularly take her nightly dose.   She will undergo a teeth implant procedure on March 23rd.   Denies chest pain, shortness of breath at rest or with normal exertion. No PND, orthopnea, LE edema or unexpected weight gain. No syncope or palpitations.  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[place])    SVD (spontaneous vaginal delivery)    x 1   Tendonitis    Hx - no problems now    Past Surgical History:  Procedure Laterality Date   BREAST REDUCTION SURGERY     DILITATION & CURRETTAGE/HYSTROSCOPY WITH HYDROTHERMAL ABLATION N/A 10/02/2016   Procedure: DILATATION & CURETTAGE/HYSTEROSCOPY WITH HYDROTHERMAL ABLATION;  Surgeon: Dian Queen, MD;  Location: [facility];  Service: Gynecology;  Laterality: N/A;  PT HAS HISTORY OF VARIEGATE PORPHYRIA HTA rep will be here confirmed on 09/07/16   TYMPANOSTOMY TUBE PLACEMENT     WISDOM TOOTH EXTRACTION  Current Medications: Current Outpatient Medications on File Prior to Visit  Medication Sig   acetaminophen (TYLENOL) 500 MG tablet Take 500-1,000 mg by mouth every 6 (six) hours as needed for moderate pain.   amLODipine (NORVASC) 5 MG tablet Take 1 tablet (5 mg total) by mouth daily.   carvedilol (COREG) 12.5 MG tablet Take 1 tablet (12.5 mg total) by mouth 2 (two) times daily.   cetirizine (ZYRTEC) 10 MG tablet Take 10 mg by mouth daily.   cholecalciferol (VITAMIN D) 1000 units tablet Take 2,000 Units by mouth daily.   fluticasone (FLONASE) 50 MCG/ACT nasal spray Place 1 spray into both nostrils every  other day.   melatonin 5 MG TABS Take 5 mg by mouth 2 (two) times a week.   methocarbamol (ROBAXIN) 500 MG tablet Take 500-1,000 mg by mouth every 6 (six) hours as needed.   No current facility-administered medications on file prior to visit.     Allergies:   Codeine, Erythromycin, Neomycin, Vancomycin, Amoxicillin, Latex, Nsaids, Propoxyphene, Sulfa antibiotics, Sulfasalazine, Tolmetin, Banana, Hydrocodone, Ibuprofen, Meperidine, Other, Oxytocin, and Penicillins   Social History   Tobacco Use   Smoking status: Every Day    Packs/day: 0.50    Years: 23.00    Pack years: 11.50    Types: Cigarettes   Smokeless tobacco: Never  Vaping Use   Vaping Use: Never used  Substance Use Topics   Alcohol use: Yes    Comment: OCCAS   Drug use: No    Family History: family history includes Aortic dissection in her father; Cancer in her maternal grandmother and paternal aunt; Dementia in her maternal grandmother; Diabetes in her maternal grandfather; Heart disease in her maternal grandmother; Hyperlipidemia in her maternal grandmother and mother; Hypertension in her father, maternal grandfather, and maternal grandmother.  ROS:   Please see the history of present illness.  (+) Stress Additional pertinent ROS otherwise unremarkable.  EKGs/Labs/Other Studies Reviewed:    The following studies were reviewed today: CTA Abdomen 12/05/20 ([place]) VASCULAR:  .  Aorta: Within normal limits.  .  Celiac trunk and branches: Within normal limits.  .  Superior mesenteric trunk and branches: Within normal limits.  .  Inferior mesenteric trunk and branches: Within normal limits.  .  Renal arteries: Left renal artery is unremarkable. There is a somewhat beaded appearance of the right renal artery with a focal area of mild narrowing (series 6 image 42).  .  Iliac arteries: Within normal limits.  .  Venous circulation: Within normal limits.   .  MSK:  .  Within normal limits.    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   EKG:  EKG is personally reviewed.   04/15/21: Sinus rhythm, rate 68 bpm 07/16/20 NSR at 73 bpm.  Recent Labs: No results found for requested labs within last 8760 hours.  Recent Lipid Panel    Component Value Date/Time   CHOL 195 11/11/2017 1345   TRIG 120.0 11/11/2017 1345   HDL 53.30 11/11/2017 1345   CHOLHDL 4 11/11/2017 1345   VLDL 24.0 11/11/2017 1345   LDLCALC 118 (H) 11/11/2017 1345    Physical Exam:    VS:  BP (!) 146/96 (BP Location: Right Arm, Patient Position: Sitting, Cuff Size: Large)    Pulse 68    Ht 5' 3.5" (1.613 m)    Wt 183 lb 6.4 oz (83.2 kg)    BMI 31.98 kg/m     Wt Readings from Last 3  Encounters:  04/15/21 183 lb 6.4 oz (83.2 kg)  10/16/20 176 lb (79.8 kg)  09/09/20 177 lb (80.3 kg)   GEN: Well nourished, well developed in no acute distress HEENT: Normal, moist mucous membranes NECK: No JVD CARDIAC: regular rhythm, normal S1 and S2, no rubs or gallops. No murmur. VASCULAR: Radial and DP pulses 2+ bilaterally. No carotid bruits RESPIRATORY:  Clear to auscultation without rales, wheezing or rhonchi  ABDOMEN: Soft, non-tender, non-distended. No bruit appreciated. MUSCULOSKELETAL:  Ambulates independently SKIN: Warm and dry, no edema NEUROLOGIC:  Alert and oriented x 3. No focal neuro deficits noted. PSYCHIATRIC:  Normal affect     ASSESSMENT:    1. Primary hypertension   2. Variegate porphyria ([place])   3. Medication management   4. Arterial fibromuscular dysplasia (HCC)   5. Renal artery stenosis (HCC)      PLAN:    Hypertension -reviewed that while porphyria can cause autonomic issues, her long term elevated BP is likely at least partially due to hypertension -dopplers at [facility] with possible RAS on the right, CT with mild fibromuscular dysplasia -discussed that recommendation is for ARB with RAS/FMD, reviewed on [place] registry and acceptable. Will start  today -tolerating amlodipine and carvedilol, continue for now  Sinus tachycardia: -now sinus rhythm in the 80s  Variegate porphyria: we reviewed today. Medications need to be checked prior to prescribing to make sure they will not worsen her symptoms. [place] list most accurate per patient.  Family history of cardiovascular disease: reviewed above  Tobacco use: would recommend cessation, precontemplative. Concerned about potential lack of autoimmune suppression if she quits.  Cardiac risk counseling and prevention recommendations: -we typically recommend the Mediterranean diet, but she has a carb-centric diet due to her porphyria -recommend moderate walking, 3-5 times/week for 30-50 minutes each session. Aim for at least 150 minutes.week. Goal should be pace of 3 miles/hours, or walking 1.5 miles in 30 minutes -recommend avoidance of tobacco products. Avoid excess alcohol. -ASCVD risk score: The 10-year ASCVD risk score (Arnett DK, et al., 2019) is: 7.3%   Values used to calculate the score:     Age: 58 years     Sex: Female     Is Non-Hispanic African American: No     Diabetic: No     Tobacco smoker: Yes     Systolic Blood Pressure: 212 mmHg     Is BP treated: Yes     HDL Cholesterol: 41 MG/DL     Total Cholesterol: 189 MG/DL    Plan for follow up: 3 months or sooner PRN  Buford Dresser, MD, PhD, [facility]    Medication Adjustments/Labs and Tests Ordered: Current medicines are reviewed at length with the patient today.  Concerns regarding medicines are outlined above.  Orders Placed This Encounter  Procedures   Basic metabolic panel   EKG 24-MGNO    Meds ordered this encounter  Medications   valsartan (DIOVAN) 40 MG tablet    Sig: Take 1 tablet (40 mg total) by mouth daily.    Dispense:  90 tablet    Refill:  3     Patient Instructions  Medication Instructions:  1). START: Valsartan 40 mg daily  *If you need a refill on your  cardiac medications before your next appointment, please call your pharmacy*   Lab Work: Your provider has recommended lab work in 2 weeks (BMP). Please have this collected at [facility] at [place]. The lab is open 8:00 am -  4:30 pm. Please avoid 12:00p - 1:00p for lunch hour. You do not need an appointment. Please go to [address] [place], [place] [address]. This is in the Primary Care office on the 3rd floor, let them know you are there for blood work and they will direct you to the lab.  If you have labs (blood work) drawn today and your tests are completely normal, you will receive your results only by: [place] (if you have MyChart) OR A paper copy in the mail If you have any lab test that is abnormal or we need to change your treatment, we will call you to review the results.   Testing/Procedures: None ordered today   Follow-Up: At [facility], you and your health needs are our priority.  As part of our continuing mission to provide you with exceptional heart care, we have created designated Provider Care Teams.  These Care Teams include your primary Cardiologist (physician) and Advanced Practice Providers (APPs -  Physician Assistants and Nurse Practitioners) who all work together to provide you with the care you need, when you need it.  We recommend signing up for the patient portal called "MyChart".  Sign up information is provided on this After Visit Summary.  MyChart is used to connect with patients for Virtual Visits (Telemedicine).  Patients are able to view lab/test results, encounter notes, upcoming appointments, etc.  Non-urgent messages can be sent to your provider as well.   To learn more about what you can do with MyChart, go to [email or web address].    Your next appointment:   July 16, 2021 @ 8:40 am   The format for your next appointment:   In Person  Provider:   Buford Dresser, MD         Wilhemina Bonito as a scribe for Buford Dresser, MD.,have documented all relevant documentation on the behalf of Buford Dresser, MD,as directed by  Buford Dresser, MD while in the presence of Buford Dresser, MD.  I, Buford Dresser, MD, have reviewed all documentation for this visit. The documentation on 04/17/21 for the exam, diagnosis, procedures, and orders are all accurate and complete.   Signed, Buford Dresser, MD PhD 04/17/2021    [place]

## 2021-04-15 NOTE — Patient Instructions (Signed)
Medication Instructions:  ?1). START: Valsartan 40 mg daily ? ?*If you need a refill on your cardiac medications before your next appointment, please call your pharmacy* ? ? ?Lab Work: ?Your provider has recommended lab work in 2 weeks (BMP). Please have this collected at [facility] at [place]. The lab is open 8:00 am - 4:30 pm. Please avoid 12:00p - 1:00p for lunch hour. You do not need an appointment. Please go to [address] [place], [place] [address]. This is in the Primary Care office on the 3rd floor, let them know you are there for blood work and they will direct you to the lab. ? ?If you have labs (blood work) drawn today and your tests are completely normal, you will receive your results only by: ?MyChart Message (if you have MyChart) OR ?A paper copy in the mail ?If you have any lab test that is abnormal or we need to change your treatment, we will call you to review the results. ? ? ?Testing/Procedures: ?None ordered today ? ? ?Follow-Up: ?At [facility], you and your health needs are our priority.  As part of our continuing mission to provide you with exceptional heart care, we have created designated Provider Care Teams.  These Care Teams include your primary Cardiologist (physician) and Advanced Practice Providers (APPs -  Physician Assistants and Nurse Practitioners) who all work together to provide you with the care you need, when you need it. ? ?We recommend signing up for the patient portal called "MyChart".  Sign up information is provided on this After Visit Summary.  MyChart is used to connect with patients for Virtual Visits (Telemedicine).  Patients are able to view lab/test results, encounter notes, upcoming appointments, etc.  Non-urgent messages can be sent to your provider as well.   ?To learn more about what you can do with MyChart, go to [email or web address].   ? ?Your next appointment:   ?July 16, 2021 @ 8:40 am  ? ?The format for your next  appointment:   ?In Person ? ?Provider:   ?Jodelle Red, MD  ? ? ? ? ? ?

## 2021-04-17 DIAGNOSIS — I701 Atherosclerosis of renal artery: Secondary | ICD-10-CM | POA: Insufficient documentation

## 2021-04-17 DIAGNOSIS — I773 Arterial fibromuscular dysplasia: Secondary | ICD-10-CM | POA: Insufficient documentation

## 2021-05-01 DIAGNOSIS — Z79899 Other long term (current) drug therapy: Secondary | ICD-10-CM | POA: Diagnosis not present

## 2021-05-02 LAB — BASIC METABOLIC PANEL
BUN/Creatinine Ratio: 14 (ref 9–23)
BUN: 9 mg/dL (ref 6–24)
CO2: 23 mmol/L (ref 20–29)
Calcium: 9.4 mg/dL (ref 8.7–10.2)
Chloride: 104 mmol/L (ref 96–106)
Creatinine, Ser: 0.63 mg/dL (ref 0.57–1.00)
Glucose: 90 mg/dL (ref 70–99)
Potassium: 4.4 mmol/L (ref 3.5–5.2)
Sodium: 140 mmol/L (ref 134–144)
eGFR: 110 mL/min/{1.73_m2} (ref >59)

## 2021-06-02 ENCOUNTER — Encounter: Payer: Self-pay | Admitting: Family Medicine

## 2021-06-21 ENCOUNTER — Other Ambulatory Visit: Payer: Self-pay | Admitting: Cardiology

## 2021-06-21 DIAGNOSIS — I10 Essential (primary) hypertension: Secondary | ICD-10-CM

## 2021-06-23 NOTE — Telephone Encounter (Signed)
Rx(s) sent to pharmacy electronically.  

## 2021-07-16 ENCOUNTER — Ambulatory Visit (INDEPENDENT_AMBULATORY_CARE_PROVIDER_SITE_OTHER): Payer: BC Managed Care – PPO | Admitting: Cardiology

## 2021-07-16 ENCOUNTER — Encounter (HOSPITAL_BASED_OUTPATIENT_CLINIC_OR_DEPARTMENT_OTHER): Payer: Self-pay | Admitting: Cardiology

## 2021-07-16 VITALS — BP 128/92 | HR 66 | Ht 63.0 in | Wt 178.0 lb

## 2021-07-16 DIAGNOSIS — I10 Essential (primary) hypertension: Secondary | ICD-10-CM | POA: Diagnosis not present

## 2021-07-16 DIAGNOSIS — Z7189 Other specified counseling: Secondary | ICD-10-CM

## 2021-07-16 DIAGNOSIS — I773 Arterial fibromuscular dysplasia: Secondary | ICD-10-CM | POA: Diagnosis not present

## 2021-07-16 DIAGNOSIS — R002 Palpitations: Secondary | ICD-10-CM | POA: Diagnosis not present

## 2021-07-16 MED ORDER — VALSARTAN 80 MG PO TABS: 80.0000 mg | ORAL_TABLET | Freq: Every day | ORAL | 3 refills | Status: DC

## 2021-07-16 NOTE — Progress Notes (Signed)
Cardiology Office Note:    Date:  07/16/2021   ID:  Chad Tiznado, DOB 1975/08/09, MRN [id number]  PCP:  Midge Minium, MD  Cardiologist:  Buford Dresser, MD  Referring MD: Midge Minium, MD   CC: follow up  History of Present Illness:    Wendy Evans is a 46 y.o. female with a hx of variegate porphyria, hypertension, Covid infection (diagnosed 02/01/20) despite vaccination, tobacco use who is seen for follow up. I initially met her 03/13/20 as a new consult at the request of Midge Minium, MD for the evaluation and management of palpitations and elevated blood pressure.  See initial note for history of her porphyria. Had never been on blood pressure medication prior to 03/2020. Had vascular duplex at 09/2020 at [facility] showing elevated velocities/upper limit of normal ratio in R renal artery. CTA 11/2020 showed somewhat beaded appearance of the right renal artery with focal area of mild narrowing consistent with FMD.  Current every day smoker.  Family history: she is adopted but recently found out her father had hypertension. He passed away age 39 during surgery for aortic rupture. Biological mother had high cholesterol. Gradnmother passed away age 37, had a pig valve in her 54s that lasted until the end of her life. Had vascular demetia and TIA history. Sister has cutaneous porphyria.  Today, she overall appears well,   She was having heart palpitations the first months of taking the valsartan. While she still has occasional palpitations, overall this has improved, except during her menses. She is on her menses now, and her prior menses was associated with a severe porphyria attack that almost sent her to the hospital. Robaxin helps slightly, but this is most helped by a very hot bath. However, after she gets out of the bath, she feels lightheaded and overall poorly. She has to lay in bed and sometimes use ice packs after this to feel better.   She overall feels better on  the valsartan. Her highest BP has been during her menses/porphyria attack, which was 201 systolic. She use to be 220s/100s with these events in the past. No very low blood pressures, now occasionally sees diastolic pressures in the 80s, but typically still in the 90s.  We reviewed using a Kardiamobile in the office today to evaluate her rhythm when she is having palpitations.  Denies chest pain, shortness of breath at rest or with normal exertion. No PND, orthopnea, LE edema or unexpected weight gain. No syncope.   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[place])    SVD (spontaneous vaginal delivery)    x 1   Tendonitis    Hx - no problems now    Past Surgical History:  Procedure Laterality Date   BREAST REDUCTION SURGERY     DILITATION & CURRETTAGE/HYSTROSCOPY WITH HYDROTHERMAL ABLATION N/A 10/02/2016   Procedure: DILATATION & CURETTAGE/HYSTEROSCOPY WITH HYDROTHERMAL ABLATION;  Surgeon: Helane Rima,  Sharyn Lull, MD;  Location: [facility];  Service: Gynecology;  Laterality: N/A;  PT HAS HISTORY OF VARIEGATE PORPHYRIA HTA rep will be here confirmed on 09/07/16   TYMPANOSTOMY TUBE PLACEMENT     WISDOM TOOTH EXTRACTION      Current Medications: Current Outpatient Medications on File Prior to Visit  Medication Sig   acetaminophen (TYLENOL) 500 MG tablet Take 500-1,000 mg by mouth every 6 (six) hours as needed for moderate pain.   amLODipine (NORVASC) 5 MG tablet TAKE 1 TABLET (5 MG TOTAL) BY MOUTH DAILY.   cetirizine (ZYRTEC) 10 MG tablet Take 10 mg by mouth daily.   cholecalciferol  (VITAMIN D) 1000 units tablet Take 2,000 Units by mouth daily.   fluticasone (FLONASE) 50 MCG/ACT nasal spray Place 1 spray into both nostrils every other day.   melatonin 5 MG TABS Take 5 mg by mouth at bedtime.   methocarbamol (ROBAXIN) 500 MG tablet Take 500-1,000 mg by mouth every 6 (six) hours as needed.   olopatadine (PATADAY) 0.1 % ophthalmic solution Place 1 drop into both eyes 2 (two) times daily.   carvedilol (COREG) 12.5 MG tablet Take 1 tablet (12.5 mg total) by mouth 2 (two) times daily.   No current facility-administered medications on file prior to visit.     Allergies:   Codeine, Erythromycin, Neomycin, Vancomycin, Amoxicillin, Latex, Nsaids, Propoxyphene, Sulfa antibiotics, Sulfasalazine, Tolmetin, Banana, Hydrocodone, Ibuprofen, Meperidine, Other, Oxytocin, and Penicillins   Social History   Tobacco Use   Smoking status: Every Day    Packs/day: 0.50    Years: 23.00    Pack years: 11.50    Types: Cigarettes   Smokeless tobacco: Never  Vaping Use   Vaping Use: Never used  Substance Use Topics   Alcohol use: Yes    Comment: OCCAS   Drug use: No    Family History: family history includes Aortic dissection in her father; Cancer in her maternal grandmother and paternal aunt; Dementia in her maternal grandmother; Diabetes in her maternal grandfather; Heart disease in her maternal grandmother; Hyperlipidemia in her maternal grandmother and mother; Hypertension in her father, maternal grandfather, and maternal grandmother.  ROS:   Please see the history of present illness.  (+) Palpitations Additional pertinent ROS otherwise unremarkable.  EKGs/Labs/Other Studies Reviewed:    The following studies were reviewed today:  CTA Abdomen 12/05/20 ([place]) VASCULAR:  .  Aorta: Within normal limits.  .  Celiac trunk and branches: Within normal limits.  .  Superior mesenteric trunk and branches: Within normal limits.  .  Inferior mesenteric trunk and  branches: Within normal limits.  .  Renal arteries: Left renal artery is unremarkable. There is a somewhat beaded appearance of the right renal artery with a focal area of mild narrowing (series 6 image 42).  .  Iliac arteries: Within normal limits.  .  Venous circulation: Within normal limits.   .  MSK:  .  Within normal limits.   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   EKG:  EKG is personally reviewed.   07/16/2021 not ordered 04/15/21: Sinus rhythm, rate 68 bpm 07/16/20 NSR at 73 bpm.  Recent Labs: 05/01/2021: BUN 9; Creatinine, Ser 0.63; Potassium 4.4; Sodium 140  Recent Lipid Panel    Component Value Date/Time   CHOL 195 11/11/2017 1345   TRIG 120.0 11/11/2017 1345   HDL 53.30 11/11/2017 1345   CHOLHDL 4 11/11/2017 1345   VLDL 24.0 11/11/2017 1345   LDLCALC 118 (H)  11/11/2017 1345    Physical Exam:    VS:  BP (!) 128/92   Pulse 66   Ht _0  (1.6 m)   Wt 178 lb (80.7 kg)   BMI 31.53 kg/m     Wt Readings from Last 3 Encounters:  07/16/21 178 lb (80.7 kg)  04/15/21 183 lb 6.4 oz (83.2 kg)  10/16/20 176 lb (79.8 kg)   GEN: Well nourished, well developed in no acute distress HEENT: Normal, moist mucous membranes NECK: No JVD CARDIAC: regular rhythm, normal S1 and S2, no rubs or gallops. No murmur. VASCULAR: Radial and DP pulses 2+ bilaterally. No carotid bruits RESPIRATORY:  Clear to auscultation without rales, wheezing or rhonchi  ABDOMEN: Soft, non-tender, non-distended. No bruit appreciated. MUSCULOSKELETAL:  Ambulates independently SKIN: Warm and dry, no edema NEUROLOGIC:  Alert and oriented x 3. No focal neuro deficits noted. PSYCHIATRIC:  Normal affect     ASSESSMENT:    1. Primary hypertension   2. Variegate porphyria ([place])   3. 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 Heart palpitations   5. Cardiac risk counseling   6. Counseling on health promotion and disease prevention    PLAN:     Hypertension Mild right renal fibromuscular dysplasia -reviewed that while porphyria can cause autonomic issues, her long term elevated BP is likely at least partially due to hypertension -dopplers at [facility] with possible RAS on the right, CT with mild fibromuscular dysplasia -previously discussed that recommendation is for ARB with RAS/FMD, reviewed on [place] registry and acceptable.  -will increase valsartan to 80 mg daily from 40 mg.  -tolerating amlodipine and carvedilol, continue for now. If BP significantly lower with increase in valsartan, may be able to drop amlodipine.  Variegate porphyria: Medications need to be checked prior to prescribing to make sure they will not worsen her symptoms. [place] list most accurate per patient.  Palpitations:  -showed her how to use Kardia mobile in office today  Likely autonomic reactivity to hot baths: while these improve her porphyria symptoms, I would recommend following them with a cool/tepid bath to lessen vasodilation and after effects  Family history of cardiovascular disease: reviewed above  Tobacco use: would recommend cessation, precontemplative. Concerned about potential lack of autoimmune suppression if she quits.  Cardiac risk counseling and prevention recommendations: -we typically recommend the Mediterranean diet, but she has a carb-centric diet due to her porphyria -recommend moderate walking, 3-5 times/week for 30-50 minutes each session. Aim for at least 150 minutes.week. Goal should be pace of 3 miles/hours, or walking 1.5 miles in 30 minutes -recommend avoidance of tobacco products. Avoid excess alcohol. -ASCVD risk score: The 10-year ASCVD risk score (Arnett DK, et al., 2019) is: 5.9%   Values used to calculate the score:     Age: 60 years     Sex: Female     Is Non-Hispanic African American: No     Diabetic: No     Tobacco smoker: Yes     Systolic Blood Pressure: 350 mmHg     Is BP treated: Yes     HDL  Cholesterol: 41 MG/DL     Total Cholesterol: 189 MG/DL    Plan for follow up: 6 months or sooner PRN  Buford Dresser, MD, PhD, [facility]    Medication Adjustments/Labs and Tests Ordered: Current medicines are reviewed at length with the patient today.  Concerns regarding medicines are outlined above.  No orders of the defined types were  placed in this encounter.   Meds ordered this encounter  Medications   valsartan (DIOVAN) 80 MG tablet    Sig: Take 1 tablet (80 mg total) by mouth daily.    Dispense:  90 tablet    Refill:  3    New dose, please wait to fill until patient calls     Patient Instructions  Medication Instructions:  Increase the valsartan to 80 mg daily (you can take two 40-mg tabs until then). Continue the carvedilol and amlodipine with it, but if you notice low numbers, call and we may drop the amlodipine.   If you start having more palpitations, look into the [facility] to check your rhythm.  *If you need a refill on your cardiac medications before your next appointment, please call your pharmacy*   Lab Work: None ordered today   Testing/Procedures: None ordered today   Follow-Up: At [facility], you and your health needs are our priority.  As part of our continuing mission to provide you with exceptional heart care, we have created designated Provider Care Teams.  These Care Teams include your primary Cardiologist (physician) and Advanced Practice Providers (APPs -  Physician Assistants and Nurse Practitioners) who all work together to provide you with the care you need, when you need it.  We recommend signing up for the patient portal called "MyChart".  Sign up information is provided on this After Visit Summary.  MyChart is used to connect with patients for Virtual Visits (Telemedicine).  Patients are able to view lab/test results, encounter notes, upcoming appointments, etc.  Non-urgent messages can be sent to your  provider as well.   To learn more about what you can do with MyChart, go to [email or web address].    Your next appointment:   3 month(s)  The format for your next appointment:   In Person  Provider:   Buford Dresser, MD    Rondell Reams as a scribe for Buford Dresser, MD.,have documented all relevant documentation on the behalf of Buford Dresser, MD,as directed by  Buford Dresser, MD while in the presence of Buford Dresser, MD.   I, Buford Dresser, MD, have reviewed all documentation for this visit. The documentation on 07/16/21 for the exam, diagnosis, procedures, and orders are all accurate and complete.   07/16/21

## 2021-07-16 NOTE — Patient Instructions (Signed)
Medication Instructions:  Increase the valsartan to 80 mg daily (you can take two 40-mg tabs until then). Continue the carvedilol and amlodipine with it, but if you notice low numbers, call and we may drop the amlodipine.   If you start having more palpitations, look into the [facility] to check your rhythm.  *If you need a refill on your cardiac medications before your next appointment, please call your pharmacy*   Lab Work: None ordered today   Testing/Procedures: None ordered today   Follow-Up: At [facility], you and your health needs are our priority.  As part of our continuing mission to provide you with exceptional heart care, we have created designated Provider Care Teams.  These Care Teams include your primary Cardiologist (physician) and Advanced Practice Providers (APPs -  Physician Assistants and Nurse Practitioners) who all work together to provide you with the care you need, when you need it.  We recommend signing up for the patient portal called "MyChart".  Sign up information is provided on this After Visit Summary.  MyChart is used to connect with patients for Virtual Visits (Telemedicine).  Patients are able to view lab/test results, encounter notes, upcoming appointments, etc.  Non-urgent messages can be sent to your provider as well.   To learn more about what you can do with MyChart, go to [email or web address].    Your next appointment:   3 month(s)  The format for your next appointment:   In Person  Provider:   Jodelle Red, MD

## 2021-09-14 DIAGNOSIS — F4320 Adjustment disorder, unspecified: Secondary | ICD-10-CM | POA: Diagnosis not present

## 2021-09-21 DIAGNOSIS — F4320 Adjustment disorder, unspecified: Secondary | ICD-10-CM | POA: Diagnosis not present

## 2021-09-28 DIAGNOSIS — F4320 Adjustment disorder, unspecified: Secondary | ICD-10-CM | POA: Diagnosis not present

## 2021-10-05 DIAGNOSIS — F4320 Adjustment disorder, unspecified: Secondary | ICD-10-CM | POA: Diagnosis not present

## 2021-10-16 ENCOUNTER — Encounter (HOSPITAL_BASED_OUTPATIENT_CLINIC_OR_DEPARTMENT_OTHER): Payer: Self-pay | Admitting: Cardiology

## 2021-10-16 ENCOUNTER — Ambulatory Visit (INDEPENDENT_AMBULATORY_CARE_PROVIDER_SITE_OTHER): Payer: BC Managed Care – PPO | Admitting: Cardiology

## 2021-10-16 VITALS — BP 142/90 | HR 73 | Ht 63.0 in | Wt 178.7 lb

## 2021-10-16 DIAGNOSIS — I773 Arterial fibromuscular dysplasia: Secondary | ICD-10-CM | POA: Diagnosis not present

## 2021-10-16 DIAGNOSIS — R002 Palpitations: Secondary | ICD-10-CM | POA: Diagnosis not present

## 2021-10-16 DIAGNOSIS — I10 Essential (primary) hypertension: Secondary | ICD-10-CM

## 2021-10-16 MED ORDER — VALSARTAN 160 MG PO TABS: 160.0000 mg | ORAL_TABLET | Freq: Every day | ORAL | 3 refills | Status: DC

## 2021-10-16 NOTE — Progress Notes (Signed)
Cardiology Office Note:    Date:  10/16/2021   ID:  Wendy Evans, DOB Feb 22, 1975, MRN [id number]  PCP:  Wendy Minium, MD  Cardiologist:  Buford Dresser, MD  Referring MD: Wendy Minium, MD   CC: follow up  History of Present Illness:    Wendy Evans is a 46 y.o. female with a hx of variegate porphyria, hypertension, multiple Covid infections despite vaccination, tobacco use who is seen for follow up. I initially met her 03/13/20 as a new consult at the request of Wendy Minium, MD for the evaluation and management of palpitations and elevated blood pressure.  See initial note for history of her porphyria. Had never been on blood pressure medication prior to 03/2020. Had vascular duplex at 09/2020 at [facility] showing elevated velocities/upper limit of normal ratio in R renal artery. CTA 11/2020 showed somewhat beaded appearance of the right renal artery with focal area of mild narrowing consistent with FMD.  Family history: she is adopted but recently found out her father had hypertension. He passed away age 32 during surgery for aortic rupture. Biological mother had high cholesterol. Gradnmother passed away age 10, had a pig valve in her 35s that lasted until the end of her life. Had vascular demetia and TIA history. Sister has cutaneous porphyria.  At her last appointment, she reported heart palpitations the first months of taking the valsartan. While she still has occasional palpitations, this had improved, except during her menses. Her prior menses was associated with a severe porphyria attack that almost sent her to the hospital. Robaxin helps slightly, but this is most helped by a very hot bath. However, after she gets out of the bath, she felt lightheaded. She overall felt better on the valsartan. Her highest BP had been during her menses/porphyria attack, which was 932 systolic. She used to be 220s/100s with these events in the past. No very low blood pressures,  occasionally saw diastolic pressures in the 80s, but typically still in the 90s. Valsartan was increased to 80 mg daily from 40 mg  Today: She notes her at home blood pressures have been good when she checks it about once a week. Per her log she is usually in the 671'I systolic such as 458/09, 983/38, with one reading as low as 129/88. Since valsartan was increased she has noticed more controlled diastolic pressures on average.  She complains of occasional palpitations. They seem to occur "together," such as having multiple episodes the same day, and then none the next day or even the next week. She may have flutters or larger palpitations. The flutters seem to frequently occur while she is driving. Her larger palpitations are associated with dizziness and typically feel very strange. However they are infrequent.  Regarding her diet she often enjoys fish, beets, mustard, garlic, grits, peanut butter. She may have red meats occasionally due to her anemia.  On 11/14/21 she will receive her final dental implant.  She denies any chest pain, shortness of breath, or peripheral edema. No headaches, syncope, orthopnea, or PND.   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[place])    SVD (spontaneous vaginal delivery)    x 1   Tendonitis    Hx - no problems now    Past Surgical History:  Procedure Laterality Date   BREAST REDUCTION SURGERY     DILITATION & CURRETTAGE/HYSTROSCOPY WITH HYDROTHERMAL ABLATION N/A 10/02/2016   Procedure: DILATATION & CURETTAGE/HYSTEROSCOPY WITH HYDROTHERMAL  ABLATION;  Surgeon: Dian Queen, MD;  Location: [facility];  Service: Gynecology;  Laterality: N/A;  PT HAS HISTORY OF VARIEGATE PORPHYRIA HTA rep will be here confirmed on 09/07/16   TYMPANOSTOMY TUBE PLACEMENT     WISDOM TOOTH EXTRACTION      Current Medications: Current Outpatient Medications on File Prior to Visit  Medication Sig   acetaminophen (TYLENOL) 500 MG tablet Take 500-1,000 mg by mouth every 6 (six) hours as needed for moderate pain.   amLODipine (NORVASC) 5 MG tablet TAKE 1 TABLET (5 MG TOTAL) BY MOUTH DAILY.   carvedilol (COREG) 12.5 MG tablet Take 1 tablet (12.5 mg total) by mouth 2 (two) times daily.   cetirizine (ZYRTEC) 10 MG tablet Take 10 mg by mouth daily.   cholecalciferol (VITAMIN D) 1000 units tablet Take 2,000 Units by mouth daily.   fluticasone (FLONASE) 50 MCG/ACT nasal spray Place 1 spray into both nostrils every other day.   melatonin 5 MG TABS Take 5 mg by mouth at bedtime.   methocarbamol (ROBAXIN) 500 MG tablet Take 500-1,000 mg by mouth every 6 (six) hours as needed.   olopatadine (PATADAY) 0.1 % ophthalmic solution Place 1 drop into both eyes 2 (two) times daily.   No current facility-administered medications on file prior to visit.     Allergies:   Codeine, Erythromycin, Neomycin, Vancomycin, Amoxicillin, Latex, Nsaids, Propoxyphene, Sulfa antibiotics, Sulfasalazine, Tolmetin, Banana, Hydrocodone, Ibuprofen, Meperidine, Other, Oxytocin, and Penicillins   Social History   Tobacco Use   Smoking status: Every Day    Packs/day: 0.50    Years: 23.00    Total pack years: 11.50    Types: Cigarettes   Smokeless tobacco: Never  Vaping Use   Vaping Use: Never used  Substance Use Topics   Alcohol use: Yes    Comment: OCCAS   Drug use: No    Family History: family history includes Aortic dissection in her father; Cancer in her maternal grandmother and paternal aunt; Dementia in her maternal grandmother; Diabetes in her maternal grandfather; Heart  disease in her maternal grandmother; Hyperlipidemia in her maternal grandmother and mother; Hypertension in her father, maternal grandfather, and maternal grandmother.  ROS:   Please see the history of present illness.  (+) Palpitations (+) Rare dizziness with larger palpitations Additional pertinent ROS otherwise unremarkable.  EKGs/Labs/Other Studies Reviewed:    The following studies were reviewed today:   CTA Abdomen 12/05/20 ([facility]) VASCULAR:  .  Aorta: Within normal limits.  .  Celiac trunk and branches: Within normal limits.  .  Superior mesenteric trunk and branches: Within normal limits.  .  Inferior mesenteric trunk and branches: Within normal limits.  .  Renal arteries: Left renal artery is unremarkable. There is a somewhat beaded appearance of the right renal artery with a focal area of mild narrowing (series 6 image 42).  .  Iliac arteries: Within normal limits.  .  Venous circulation: Within normal limits.   Marland Kitchen  MSK:  .  Within normal limits.   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  Bilateral Artery Duplex  10/03/2020  ([facility]): FINDINGS:  Aorta: No aneurysm or dissection. Maximal diameter = 2.0 cm.   Peak systolic velocity in the abdominal aorta at the level of renal arteries:  75 cm/sec.   The kidneys are normal in echogenicity, size, and contour. No renal masses, stones, perinephric fluid, or hydronephrosis. Right kidney measures 10.8 cm. Left kidney measures 10.6 cm.   Duplex evaluation of the renal artery vasculature as follows:   RIGHT KIDNEY:  Resistive indices range from 0.56 to 0.61.  Peak systolic velocities of the right renal artery as follows:  .  Proximal: 130 cm/sec  .  Mid: 242 cm/sec  .  Distal: 169 cm/sec  Right renal artery to aorta ratio is 3.2.   LEFT KIDNEY:  Resistive indices range from 0.56 to 0.68.  Peak systolic velocities of the left renal artery as  follows:  .  Proximal: 113 cm/sec  .  Mid: 110 cm/sec  .  Distal: 178 cm/sec  Left renal artery to aorta ratio is 2.4.   Additional findings: Right lobe hepatic cyst with thin internal septation measuring up to 2.8 cm.   CONCLUSION:  1. Elevated velocity and upper limits of normal renal artery to aorta ratio in the mid right renal artery. This raises the possibility of arterial stenosis. Consider further evaluation with CT angiography.   EKG:  EKG is personally reviewed.   10/16/2021:  EKG was not ordered. 07/16/2021 not ordered 04/15/21: Sinus rhythm, rate 68 bpm 07/16/20 NSR at 73 bpm.  Recent Labs: 05/01/2021: BUN 9; Creatinine, Ser 0.63; Potassium 4.4; Sodium 140   Recent Lipid Panel    Component Value Date/Time   CHOL 195 11/11/2017 1345   TRIG 120.0 11/11/2017 1345   HDL 53.30 11/11/2017 1345   CHOLHDL 4 11/11/2017 1345   VLDL 24.0 11/11/2017 1345   LDLCALC 118 (H) 11/11/2017 1345    Physical Exam:    VS:  BP (!) 142/90 (BP Location: Right Arm, Patient Position: Sitting, Cuff Size: Normal)   Pulse 73   Ht '5\' 3"'  (1.6 m)   Wt 178 lb 11.2 oz (81.1 kg)   SpO2 99%   BMI 31.66 kg/m     Wt Readings from Last 3 Encounters:  10/16/21 178 lb 11.2 oz (81.1 kg)  07/16/21 178 lb (80.7 kg)  04/15/21 183 lb 6.4 oz (83.2 kg)   GEN: Well nourished, well developed in no acute distress HEENT: Normal, moist mucous membranes NECK: No JVD CARDIAC: regular rhythm, normal S1 and S2, no rubs or gallops. No murmur. VASCULAR: Radial and DP pulses 2+ bilaterally. No carotid bruits RESPIRATORY:  Clear to auscultation without rales, wheezing or rhonchi  ABDOMEN: Soft, non-tender, non-distended. No bruit appreciated. MUSCULOSKELETAL:  Ambulates independently SKIN: Warm and dry, no edema NEUROLOGIC:  Alert and oriented x 3. No focal neuro deficits noted. PSYCHIATRIC:  Normal affect     ASSESSMENT:    1. 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 Primary hypertension   3.  Variegate porphyria ([place])   4. Heart palpitations     PLAN:    Hypertension Mild right renal fibromuscular dysplasia -dopplers at [facility] with possible RAS on the right, CT with mild fibromuscular dysplasia -recommendation is for ARB with RAS/FMD, reviewed on [place] registry and acceptable.  -responding well to valsartan, diastolic numbers improving. Will increase to 160 mg  valsartan today.  -tolerating amlodipine and carvedilol, continue for now. If BP significantly lower with increase in valsartan, may be able to drop amlodipine.  Variegate porphyria: Medications need to be checked prior to prescribing to make sure they will not worsen her symptoms. [place] list most accurate per patient.  Palpitations:  -we have discussed [place]  Likely autonomic reactivity to hot baths: while these improve her porphyria symptoms, I would recommend following them with a cool/tepid bath to lessen vasodilation and after effects  Family history of cardiovascular disease: reviewed above  Tobacco use: would recommend cessation, precontemplative. Concerned about potential lack of autoimmune suppression if she quits.  Cardiac risk counseling and prevention recommendations: -we typically recommend the Mediterranean diet, but she has a carb-centric diet due to her porphyria -recommend moderate walking, 3-5 times/week for 30-50 minutes each session. Aim for at least 150 minutes.week. Goal should be pace of 3 miles/hours, or walking 1.5 miles in 30 minutes -recommend avoidance of tobacco products. Avoid excess alcohol. -ASCVD risk score: The 10-year ASCVD risk score (Arnett DK, et al., 2019) is: 7.2%   Values used to calculate the score:     Age: 22 years     Sex: Female     Is Non-Hispanic African American: No     Diabetic: No     Tobacco smoker: Yes     Systolic Blood Pressure: 465 mmHg     Is BP treated: Yes     HDL Cholesterol: 41 MG/DL     Total Cholesterol: 189 MG/DL    Plan for follow  up: 3 months or sooner as needed.  Buford Dresser, MD, PhD, [facility]    Medication Adjustments/Labs and Tests Ordered: Current medicines are reviewed at length with the patient today.  Concerns regarding medicines are outlined above.   No orders of the defined types were placed in this encounter.  Meds ordered this encounter  Medications   valsartan (DIOVAN) 160 MG tablet    Sig: Take 1 tablet (160 mg total) by mouth daily.    Dispense:  90 tablet    Refill:  3    New dose, please wait to fill until patient calls   Patient Instructions  Medication Instructions:  We are increasing valsartan from 80 mg to 160 mg daily. You can take two pills of the 80 mg until you fill the 160 mg dose. We may be able to scale back on the amlodipine in the future if this controls your blood pressure well.  *If you need a refill on your cardiac medications before your next appointment, please call your pharmacy*   Lab Work: None ordered today   Testing/Procedures: None ordered today   Follow-Up: At [facility], you and your health needs are our priority.  As part of our continuing mission to provide you with exceptional heart care, we have created designated Provider Care Teams.  These Care Teams include your primary Cardiologist (physician) and Advanced Practice Providers (APPs -  Physician Assistants and Nurse Practitioners) who all work together to provide you with the care you need, when you need it.  We recommend signing up for the patient portal called "MyChart".  Sign up information is provided on this After Visit Summary.  MyChart is used to connect with patients for Virtual Visits (Telemedicine).  Patients are able to view lab/test results, encounter notes, upcoming appointments, etc.  Non-urgent messages can be sent to your provider as well.   To learn more  about what you can do with MyChart, go to [email or web address].    Your next  appointment:   3 month(s)  The format for your next appointment:   In Person  Provider:   Buford Dresser, MD            [facility],acting as a scribe for Buford Dresser, MD.,have documented all relevant documentation on the behalf of Buford Dresser, MD,as directed by  Buford Dresser, MD while in the presence of Buford Dresser, MD.  I, Buford Dresser, MD, have reviewed all documentation for this visit. The documentation on 10/16/21 for the exam, diagnosis, procedures, and orders are all accurate and complete.   Signed, Buford Dresser, MD  10/16/2021  8:58 AM  [place]

## 2021-10-16 NOTE — Patient Instructions (Addendum)
Medication Instructions:  We are increasing valsartan from 80 mg to 160 mg daily. You can take two pills of the 80 mg until you fill the 160 mg dose. We may be able to scale back on the amlodipine in the future if this controls your blood pressure well.  *If you need a refill on your cardiac medications before your next appointment, please call your pharmacy*   Lab Work: None ordered today   Testing/Procedures: None ordered today   Follow-Up: At [facility], you and your health needs are our priority.  As part of our continuing mission to provide you with exceptional heart care, we have created designated Provider Care Teams.  These Care Teams include your primary Cardiologist (physician) and Advanced Practice Providers (APPs -  Physician Assistants and Nurse Practitioners) who all work together to provide you with the care you need, when you need it.  We recommend signing up for the patient portal called "MyChart".  Sign up information is provided on this After Visit Summary.  MyChart is used to connect with patients for Virtual Visits (Telemedicine).  Patients are able to view lab/test results, encounter notes, upcoming appointments, etc.  Non-urgent messages can be sent to your provider as well.   To learn more about what you can do with MyChart, go to [email or web address].    Your next appointment:   3 month(s)  The format for your next appointment:   In Person  Provider:   Jodelle Red, MD

## 2021-11-01 ENCOUNTER — Other Ambulatory Visit (HOSPITAL_BASED_OUTPATIENT_CLINIC_OR_DEPARTMENT_OTHER): Payer: Self-pay | Admitting: Cardiology

## 2021-11-01 DIAGNOSIS — I10 Essential (primary) hypertension: Secondary | ICD-10-CM

## 2021-11-21 DIAGNOSIS — M25562 Pain in left knee: Secondary | ICD-10-CM | POA: Diagnosis not present

## 2021-12-03 DIAGNOSIS — M25562 Pain in left knee: Secondary | ICD-10-CM | POA: Diagnosis not present

## 2021-12-21 ENCOUNTER — Other Ambulatory Visit (HOSPITAL_BASED_OUTPATIENT_CLINIC_OR_DEPARTMENT_OTHER): Payer: Self-pay | Admitting: Cardiology

## 2021-12-21 DIAGNOSIS — I10 Essential (primary) hypertension: Secondary | ICD-10-CM

## 2021-12-29 ENCOUNTER — Ambulatory Visit (INDEPENDENT_AMBULATORY_CARE_PROVIDER_SITE_OTHER): Payer: BC Managed Care – PPO | Admitting: Family Medicine

## 2021-12-29 DIAGNOSIS — Z23 Encounter for immunization: Secondary | ICD-10-CM | POA: Diagnosis not present

## 2021-12-29 NOTE — Progress Notes (Signed)
Pt received her egg free Flu vaccine today tolerated injection well

## 2022-01-19 NOTE — Progress Notes (Signed)
Cardiology Office Note:    Date:  01/21/2022   ID:  Wendy Evans, DOB 1975/07/28, MRN [id number]  PCP:  Midge Minium, MD  Cardiologist:  Buford Dresser, MD  Referring MD: Midge Minium, MD   CC: follow up  History of Present Illness:    Wendy Evans is a 46 y.o. female with a hx of variegate porphyria, hypertension, multiple Covid infections despite vaccination, tobacco use who is seen for follow up. I initially met her 03/13/20 as a new consult at the request of Midge Minium, MD for the evaluation and management of palpitations and elevated blood pressure.  See initial note for history of her porphyria. Had never been on blood pressure medication prior to 03/2020. Had vascular duplex at 09/2020 at [facility] showing elevated velocities/upper limit of normal ratio in R renal artery. CTA 11/2020 showed somewhat beaded appearance of the right renal artery with focal area of mild narrowing consistent with FMD.  Family history: she is adopted but recently found out her father had hypertension. He passed away age 77 during surgery for aortic rupture. Biological mother had high cholesterol. Gradnmother passed away age 65, had a pig valve in her 76s that lasted until the end of her life. Had vascular dementia and TIA history. Sister has cutaneous porphyria.  At a previous appointment, she reported heart palpitations the first months of taking the valsartan. Her prior menses were associated with a severe porphyria attack that almost sent her to the hospital. Robaxin helps slightly, but this is most helped by a very hot bath. Her highest BP had been during her menses/porphyria attack, which was 269 systolic. She used to be 220s/100s with these events in the past. No very low blood pressures, occasionally saw diastolic pressures in the 80s, but typically still in the 90s. Valsartan was increased to 80 mg daily from 40 mg.  At her last visit, her BP log showed averages of 485I systolic  with more controlled diastolic pressures since valsartan was increased. She complains of occasional palpitations, including flutters or larger palpitations. Her larger palpitations were associated with dizziness. She planned to receive her final dental implant 11/2021.  Valsartan was increased to 160 mg.   Today, she reports more frequent palpitations occurring every day, multiple times. They used to be every couple weeks, but now happen daily. She describes them as her heart "jumps." Usually her palpitations occur while she is sitting or while driving. At work, she notes that she is typically sitting in a cross-legged position. If her palpitations occur for too long at one time, she will feel lightheaded. Of note, her "jumps" or catches are distinct from her flutters as described at her last visit. She believes her palpitations have been caused or exacerbated by COVID. She has been infected with COVID 3 times and received 2 vaccinations.    Sometimes she monitors her BP at home. It runs in the 120s-130s/90s on average. Her sister is on 5 antihypertensives and has subcutaneous porphyria.   She also complains of neuropathy on her posterior hands and feet.  She denies any chest pain, shortness of breath, or peripheral edema. No headaches, syncope, orthopnea, or PND.   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[place])    SVD (spontaneous vaginal delivery)    x 1   Tendonitis    Hx - no problems now    Past Surgical History:  Procedure Laterality Date   BREAST REDUCTION SURGERY      DILITATION & CURRETTAGE/HYSTROSCOPY WITH HYDROTHERMAL ABLATION N/A 10/02/2016   Procedure: DILATATION & CURETTAGE/HYSTEROSCOPY WITH HYDROTHERMAL ABLATION;  Surgeon: Dian Queen, MD;  Location: [facility];  Service: Gynecology;  Laterality: N/A;  PT HAS HISTORY OF VARIEGATE PORPHYRIA HTA rep will be here confirmed on 09/07/16   TYMPANOSTOMY TUBE PLACEMENT     WISDOM TOOTH EXTRACTION      Current Medications: Current Outpatient Medications on File Prior to Visit  Medication Sig   acetaminophen (TYLENOL) 500 MG tablet Take 500-1,000 mg by mouth every 6 (six) hours as needed for moderate pain.   amLODipine (NORVASC) 5 MG tablet TAKE 1 TABLET (5 MG TOTAL) BY MOUTH DAILY.   carvedilol (COREG) 12.5 MG tablet Take 1 tablet (12.5 mg total) by mouth 2 (two) times daily.   cetirizine (ZYRTEC) 10 MG tablet Take 10 mg by mouth daily.   cholecalciferol (VITAMIN D) 1000 units tablet Take 2,000 Units by mouth 2 (two) times a week.   fluticasone (FLONASE) 50 MCG/ACT nasal spray Place 1 spray into both nostrils every other day.   melatonin 5 MG TABS Take 5 mg by mouth every other day.   meloxicam (MOBIC) 7.5 MG tablet Take 7.5 mg by mouth as needed for pain.   methocarbamol (ROBAXIN) 500 MG tablet Take 500-1,000 mg by mouth every 6 (six) hours as needed.   olopatadine (PATADAY) 0.1 % ophthalmic solution Place 1 drop into both eyes 2 (two) times daily.   valsartan (DIOVAN) 160 MG tablet Take 1 tablet (160 mg total) by mouth daily.   No current facility-administered medications on file prior to visit.     Allergies:   Codeine, Erythromycin, Neomycin, Vancomycin, Amoxicillin, Latex, Nsaids, Propoxyphene, Sulfa antibiotics, Sulfasalazine, Tolmetin, Banana, Hydrocodone, Ibuprofen, Meperidine, Other, Oxytocin, and Penicillins   Social History   Tobacco Use   Smoking status: Every Day    Packs/day: 0.50    Years: 23.00    Total pack years: 11.50    Types: Cigarettes   Smokeless tobacco: Never  Vaping Use    Vaping Use: Never used  Substance Use Topics   Alcohol use: Yes    Comment: OCCAS   Drug use: No    Family History: family history includes Aortic dissection in her father; Cancer in her maternal grandmother and paternal aunt; Dementia in her maternal grandmother; Diabetes in her maternal grandfather; Heart disease in her maternal grandmother; Hyperlipidemia in her maternal grandmother and mother; Hypertension in her father, maternal grandfather, and maternal grandmother.  ROS:   Please see the history of present illness.  (+) Palpitations (+) Myalgias (+) Lightheadedness (+) Neuropathy Additional pertinent ROS otherwise unremarkable.  EKGs/Labs/Other Studies Reviewed:    The following studies were reviewed today:   CTA Abdomen 12/05/20 ([place]) VASCULAR:  .  Aorta: Within normal limits.  .  Celiac trunk and branches: Within normal limits.  .  Superior mesenteric trunk and branches: Within normal limits.  Marland Kitchen  Inferior mesenteric trunk and branches: Within normal limits.  .  Renal arteries: Left renal artery is unremarkable. There is a somewhat beaded appearance of the right renal artery with a focal area of mild narrowing (series 6 image 42).  .  Iliac arteries: Within normal limits.  .  Venous circulation: Within normal limits.   .  MSK:  .  Within normal limits.   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  Bilateral Artery Duplex  10/03/2020  ([place]): FINDINGS:  Aorta: No aneurysm or dissection. Maximal diameter = 2.0 cm.   Peak systolic velocity in the abdominal aorta at the level of renal arteries:  75 cm/sec.   The kidneys are normal in echogenicity, size, and contour. No renal masses, stones, perinephric fluid, or hydronephrosis. Right kidney measures 10.8 cm. Left kidney measures 10.6 cm.   Duplex evaluation of the renal artery vasculature as follows:   RIGHT KIDNEY:  Resistive indices  range from 0.56 to 0.61.  Peak systolic velocities of the right renal artery as follows:  .  Proximal: 130 cm/sec  .  Mid: 242 cm/sec  .  Distal: 169 cm/sec  Right renal artery to aorta ratio is 3.2.   LEFT KIDNEY:  Resistive indices range from 0.56 to 0.68.  Peak systolic velocities of the left renal artery as follows:  .  Proximal: 113 cm/sec  .  Mid: 110 cm/sec  .  Distal: 178 cm/sec  Left renal artery to aorta ratio is 2.4.   Additional findings: Right lobe hepatic cyst with thin internal septation measuring up to 2.8 cm.   CONCLUSION:  1. Elevated velocity and upper limits of normal renal artery to aorta ratio in the mid right renal artery. This raises the possibility of arterial stenosis. Consider further evaluation with CT angiography.   EKG:  EKG is personally reviewed.   01/20/2022:  NSR at 70 bpm 04/15/21: Sinus rhythm, rate 68 bpm 07/16/20 NSR at 73 bpm.  Recent Labs: 05/01/2021: BUN 9; Creatinine, Ser 0.63; Potassium 4.4; Sodium 140   Recent Lipid Panel    Component Value Date/Time   CHOL 195 11/11/2017 1345   TRIG 120.0 11/11/2017 1345   HDL 53.30 11/11/2017 1345   CHOLHDL 4 11/11/2017 1345   VLDL 24.0 11/11/2017 1345   LDLCALC 118 (H) 11/11/2017 1345    Physical Exam:    VS:  BP (!) 137/93 (BP Location: Left Arm, Patient Position: Sitting, Cuff Size: Normal)   Pulse 70   Ht 5' 3" (1.6 m)   Wt 177 lb 6.4 oz (80.5 kg)   SpO2 100%   BMI 31.42 kg/m     Wt Readings from Last 3 Encounters:  01/20/22 177 lb 6.4 oz (80.5 kg)  10/16/21 178 lb 11.2 oz (81.1 kg)  07/16/21 178 lb (80.7 kg)   GEN: Well nourished, well developed in no acute distress HEENT: Normal, moist mucous membranes NECK: No JVD CARDIAC: regular rhythm, normal S1 and S2, no rubs or gallops. No murmur. VASCULAR: Radial and DP pulses 2+ bilaterally. No carotid bruits RESPIRATORY:  Clear to auscultation without rales, wheezing or rhonchi  ABDOMEN: Soft, non-tender, non-distended. No bruit  appreciated. MUSCULOSKELETAL:  Ambulates independently SKIN: Warm and dry, no edema NEUROLOGIC:  Alert and oriented x 3. No focal neuro deficits noted. PSYCHIATRIC:  Normal affect     ASSESSMENT:    1. Heart palpitations   2. Primary hypertension   3. Variegate porphyria ([place])   4. 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  -responding well to valsartan, continue current dose -tolerating amlodipine and carvedilol, continue  Variegate porphyria: Medications need to be checked prior to prescribing to make sure they will not worsen her symptoms. [place] list most accurate per patient.  Palpitations:  -we have discussed [place] -she will contact me if these symptoms last longer/become more bothersome and we will pursue a monitor  Family history of cardiovascular disease: reviewed above  Tobacco use: would recommend cessation, precontemplative. Concerned about potential lack of autoimmune suppression if she quits.  Cardiac risk counseling and prevention recommendations: -we typically recommend the Mediterranean diet, but she has a carb-centric diet due to her porphyria -recommend moderate walking, 3-5 times/week for 30-50 minutes each session. Aim for at least 150 minutes.week. Goal should be pace of 3 miles/hours, or walking 1.5 miles in 30 minutes -recommend avoidance of tobacco products. Avoid excess alcohol. -ASCVD risk score: The 10-year ASCVD risk score (Arnett DK, et al., 2019) is: 6.7%   Values used to calculate the score:     Age: 20 years     Sex: Female     Is Non-Hispanic African American: No     Diabetic: No     Tobacco smoker: Yes     Systolic Blood Pressure: 161 mmHg     Is BP treated: Yes     HDL  Cholesterol: 41 MG/DL     Total Cholesterol: 189 MG/DL    Plan for follow up: 6 months or sooner as needed.  Buford Dresser, MD, PhD, [facility]    Medication Adjustments/Labs and Tests Ordered: Current medicines are reviewed at length with the patient today.  Concerns regarding medicines are outlined above.   Orders Placed This Encounter  Procedures   EKG 12-Lead   No orders of the defined types were placed in this encounter.  Patient Instructions  Medication Instructions:  Your Physician recommend you continue on your current medication as directed.    *If you need a refill on your cardiac medications before your next appointment, please call your pharmacy*  Follow-Up: At [facility], you and your health needs are our priority.  As part of our continuing mission to provide you with exceptional heart care, we have created designated Provider Care Teams.  These Care Teams include your primary Cardiologist (physician) and Advanced Practice Providers (APPs -  Physician Assistants and Nurse Practitioners) who all work together to provide you with the care you need, when you need it.  We recommend signing up for the patient portal called "MyChart".  Sign up information is provided on this After Visit Summary.  MyChart is used to connect with patients for Virtual Visits (Telemedicine).  Patients are able to view lab/test results, encounter notes, upcoming appointments, etc.  Non-urgent messages can be sent to your provider as well.   To learn more about what you can do with MyChart, go to [email or web address].    Your next appointment:   June 13th at [facility] with Dr. Randall An [place] as a scribe for Buford Dresser, MD.,have documented all relevant documentation on the behalf of Buford Dresser, MD,as directed by  Buford Dresser, MD while in the presence of Buford Dresser, MD.  I, Buford Dresser,  MD,  have reviewed all documentation for this visit. The documentation on 01/21/22 for the exam, diagnosis, procedures, and orders are all accurate and complete.   Signed, Buford Dresser, MD  01/21/2022  3:19 PM  [place]

## 2022-01-20 ENCOUNTER — Ambulatory Visit (HOSPITAL_BASED_OUTPATIENT_CLINIC_OR_DEPARTMENT_OTHER): Payer: BC Managed Care – PPO | Admitting: Cardiology

## 2022-01-20 ENCOUNTER — Encounter (HOSPITAL_BASED_OUTPATIENT_CLINIC_OR_DEPARTMENT_OTHER): Payer: Self-pay | Admitting: Cardiology

## 2022-01-20 VITALS — BP 137/93 | HR 70 | Ht 63.0 in | Wt 177.4 lb

## 2022-01-20 DIAGNOSIS — I10 Essential (primary) hypertension: Secondary | ICD-10-CM

## 2022-01-20 DIAGNOSIS — I773 Arterial fibromuscular dysplasia: Secondary | ICD-10-CM

## 2022-01-20 DIAGNOSIS — R002 Palpitations: Secondary | ICD-10-CM | POA: Diagnosis not present

## 2022-01-20 NOTE — Patient Instructions (Addendum)
Medication Instructions:  Your Physician recommend you continue on your current medication as directed.    *If you need a refill on your cardiac medications before your next appointment, please call your pharmacy*  Follow-Up: At [facility], you and your health needs are our priority.  As part of our continuing mission to provide you with exceptional heart care, we have created designated Provider Care Teams.  These Care Teams include your primary Cardiologist (physician) and Advanced Practice Providers (APPs -  Physician Assistants and Nurse Practitioners) who all work together to provide you with the care you need, when you need it.  We recommend signing up for the patient portal called "MyChart".  Sign up information is provided on this After Visit Summary.  MyChart is used to connect with patients for Virtual Visits (Telemedicine).  Patients are able to view lab/test results, encounter notes, upcoming appointments, etc.  Non-urgent messages can be sent to your provider as well.   To learn more about what you can do with MyChart, go to [email or web address].    Your next appointment:   June 13th at 8am with Dr. Cristal Deer

## 2022-01-21 ENCOUNTER — Encounter (HOSPITAL_BASED_OUTPATIENT_CLINIC_OR_DEPARTMENT_OTHER): Payer: Self-pay | Admitting: Cardiology

## 2022-03-26 DIAGNOSIS — L918 Other hypertrophic disorders of the skin: Secondary | ICD-10-CM | POA: Diagnosis not present

## 2022-03-26 DIAGNOSIS — D225 Melanocytic nevi of trunk: Secondary | ICD-10-CM | POA: Diagnosis not present

## 2022-03-27 ENCOUNTER — Other Ambulatory Visit: Payer: Self-pay | Admitting: Cardiology

## 2022-03-27 DIAGNOSIS — I10 Essential (primary) hypertension: Secondary | ICD-10-CM

## 2022-05-17 ENCOUNTER — Other Ambulatory Visit (HOSPITAL_BASED_OUTPATIENT_CLINIC_OR_DEPARTMENT_OTHER): Payer: Self-pay | Admitting: Cardiology

## 2022-05-18 NOTE — Telephone Encounter (Signed)
Rx request sent to pharmacy.  

## 2022-06-14 DIAGNOSIS — F4320 Adjustment disorder, unspecified: Secondary | ICD-10-CM | POA: Diagnosis not present

## 2022-07-05 DIAGNOSIS — F4320 Adjustment disorder, unspecified: Secondary | ICD-10-CM | POA: Diagnosis not present

## 2022-07-23 ENCOUNTER — Ambulatory Visit (HOSPITAL_BASED_OUTPATIENT_CLINIC_OR_DEPARTMENT_OTHER): Payer: BC Managed Care – PPO | Admitting: Cardiology

## 2022-07-23 ENCOUNTER — Encounter (HOSPITAL_BASED_OUTPATIENT_CLINIC_OR_DEPARTMENT_OTHER): Payer: Self-pay | Admitting: Cardiology

## 2022-07-23 VITALS — BP 128/88 | HR 84 | Ht 63.0 in | Wt 184.3 lb

## 2022-07-23 DIAGNOSIS — Z7189 Other specified counseling: Secondary | ICD-10-CM

## 2022-07-23 DIAGNOSIS — I10 Essential (primary) hypertension: Secondary | ICD-10-CM | POA: Diagnosis not present

## 2022-07-23 DIAGNOSIS — R002 Palpitations: Secondary | ICD-10-CM

## 2022-07-23 DIAGNOSIS — I773 Arterial fibromuscular dysplasia: Secondary | ICD-10-CM | POA: Diagnosis not present

## 2022-07-23 DIAGNOSIS — Z8249 Family history of ischemic heart disease and other diseases of the circulatory system: Secondary | ICD-10-CM

## 2022-07-23 NOTE — Patient Instructions (Signed)
Medication Instructions:  Your physician recommends that you continue on your current medications as directed. Please refer to the Current Medication list given to you today.   *If you need a refill on your cardiac medications before your next appointment, please call your pharmacy*  Lab Work: NONE  Testing/Procedures: NONE   Follow-Up: At [facility], you and your health needs are our priority.  As part of our continuing mission to provide you with exceptional heart care, we have created designated Provider Care Teams.  These Care Teams include your primary Cardiologist (physician) and Advanced Practice Providers (APPs -  Physician Assistants and Nurse Practitioners) who all work together to provide you with the care you need, when you need it.  We recommend signing up for the patient portal called "MyChart".  Sign up information is provided on this After Visit Summary.  MyChart is used to connect with patients for Virtual Visits (Telemedicine).  Patients are able to view lab/test results, encounter notes, upcoming appointments, etc.  Non-urgent messages can be sent to your provider as well.   To learn more about what you can do with MyChart, go to [email or web address].    Your next appointment:   6 month(s)  The format for your next appointment:   In Person  Provider:   Bridgette Christopher, MD             

## 2022-07-23 NOTE — Progress Notes (Signed)
Cardiology Office Note:    Date:  07/23/2022   ID:  Wendy Evans, DOB [birth year], MRN [id number]  PCP:  Sheliah Hatch, MD  Cardiologist:  Jodelle Red, MD  Referring MD: Sheliah Hatch, MD   CC: follow up  History of Present Illness:    Wendy Evans is a 47 y.o. female with a hx of variegate porphyria, hypertension, multiple Covid infections despite vaccination, tobacco use who is seen for follow up. I initially met her 03/13/20 as a new consult at the request of Sheliah Hatch, MD for the evaluation and management of palpitations and elevated blood pressure.  See initial note for history of her porphyria. Had never been on blood pressure medication prior to 03/2020. Had vascular duplex at 09/2020 at [facility] showing elevated velocities/upper limit of normal ratio in R renal artery. CTA 11/2020 showed somewhat beaded appearance of the right renal artery with focal area of mild narrowing consistent with FMD.  Family history: she is adopted but recently found out her father had hypertension. He passed away age 25 during surgery for aortic rupture. Biological mother had high cholesterol. Gradnmother passed away age 31, had a pig valve in her 70s that lasted until the end of her life. Had vascular dementia and TIA history. Sister has cutaneous porphyria.  Today, she reports she is now back in therapy. Lately she does have significant life stressors at home. However, her palpitations have greatly improved. Maybe 4 episodes of palpitations since her last visit.  Generally she is trying to change positions more slowly, and has lowered the temperature of her baths. She still is feeling overheated at times following her menses. No dizzy spells.  In clinic her blood pressure is 128/88. At home, her baseline blood pressure per her home cuff averages 130s/upper 90s. Previously her home cuff was checked for accuracy at her pharmacist visit, and was found to be 9 points higher.  Every  once in a while she may notice mild swelling in her left leg. Her swelling has been worse flying on airplanes. Revisited discussion of the benefits of compression socks.  She has been considering joining the gym, maybe by next Fall.   She denies any chest pain, shortness of breath, headaches, syncope, orthopnea, or PND.   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HCC)    SVD (spontaneous vaginal delivery)    x 1   Tendonitis    Hx - no problems now    Past Surgical History:  Procedure Laterality Date   BREAST REDUCTION SURGERY     DILITATION & CURRETTAGE/HYSTROSCOPY WITH HYDROTHERMAL ABLATION N/A 10/02/2016   Procedure: DILATATION & CURETTAGE/HYSTEROSCOPY WITH HYDROTHERMAL ABLATION;  Surgeon: Marcelle Overlie, MD;  Location: [facility];  Service: Gynecology;  Laterality: N/A;  PT HAS HISTORY OF VARIEGATE PORPHYRIA HTA rep will be here confirmed on 09/07/16   TYMPANOSTOMY TUBE PLACEMENT     WISDOM TOOTH  EXTRACTION      Current Medications: Current Outpatient Medications on File Prior to Visit  Medication Sig   acetaminophen (TYLENOL) 500 MG tablet Take 500-1,000 mg by mouth every 6 (six) hours as needed for moderate pain.   amLODipine (NORVASC) 5 MG tablet TAKE 1 TABLET (5 MG TOTAL) BY MOUTH DAILY.   carvedilol (COREG) 12.5 MG tablet TAKE 1 TABLET BY MOUTH 2 TIMES DAILY.   cetirizine (ZYRTEC) 10 MG tablet Take 10 mg by mouth daily.   cholecalciferol (VITAMIN D) 1000 units tablet Take 2,000 Units by mouth 2 (two) times a week.   fluticasone (FLONASE) 50 MCG/ACT nasal spray  Place 1 spray into both nostrils every other day.   melatonin 5 MG TABS Take 5 mg by mouth as directed. 2 times a week   meloxicam (MOBIC) 7.5 MG tablet Take 7.5 mg by mouth as needed for pain.   methocarbamol (ROBAXIN) 500 MG tablet Take 500-1,000 mg by mouth every 6 (six) hours as needed.   olopatadine (PATADAY) 0.1 % ophthalmic solution Place 1 drop into both eyes as needed.   valsartan (DIOVAN) 160 MG tablet Take 1 tablet (160 mg total) by mouth daily.   No current facility-administered medications on file prior to visit.     Allergies:   Codeine, Erythromycin, Neomycin, Vancomycin, Amoxicillin, Latex, Nsaids, Propoxyphene, Sulfa antibiotics, Sulfasalazine, Tolmetin, Banana, Hydrocodone, Ibuprofen, Meperidine, Other, Oxytocin, and Penicillins   Social History   Tobacco Use   Smoking status: Every Day    Packs/day: 0.50    Years: 23.00    Additional pack years: 0.00    Total pack years: 11.50    Types: Cigarettes   Smokeless tobacco: Never  Vaping Use   Vaping Use: Never used  Substance Use Topics   Alcohol use: Yes    Comment: OCCAS   Drug use: No    Family History: family history includes Aortic dissection in her father; Cancer in her maternal grandmother and paternal aunt; Dementia in her maternal grandmother; Diabetes in her maternal grandfather; Heart disease in her maternal grandmother; Hyperlipidemia in her maternal grandmother and mother; Hypertension in her father, maternal grandfather, and maternal grandmother.  ROS:   Please see the history of present illness.  (+) Stress (+) Rare palpitations (+) Mild LE edema Additional pertinent ROS otherwise unremarkable.  EKGs/Labs/Other Studies Reviewed:    The following studies were reviewed today:   CTA Abdomen 12/05/20 ([facility]) VASCULAR:  .  Aorta: Within normal limits.  .  Celiac trunk and branches: Within normal limits.  .  Superior mesenteric trunk and branches: Within normal limits.  .   Inferior mesenteric trunk and branches: Within normal limits.  .  Renal arteries: Left renal artery is unremarkable. There is a somewhat beaded appearance of the right renal artery with a focal area of mild narrowing (series 6 image 42).  .  Iliac arteries: Within normal limits.  .  Venous circulation: Within normal limits.   .  MSK:  .  Within normal limits.   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  Bilateral Artery Duplex  10/03/2020  ([facility]): FINDINGS:  Aorta: No aneurysm or dissection. Maximal diameter = 2.0 cm.   Peak systolic velocity in the abdominal aorta at the level of renal arteries:  75 cm/sec.   The kidneys are normal in echogenicity, size, and contour. No renal masses, stones, perinephric fluid, or hydronephrosis. Right kidney measures 10.8 cm. Left kidney measures 10.6 cm.  Duplex evaluation of the renal artery vasculature as follows:   RIGHT KIDNEY:  Resistive indices range from 0.56 to 0.61.  Peak systolic velocities of the right renal artery as follows:  .  Proximal: 130 cm/sec  .  Mid: 242 cm/sec  .  Distal: 169 cm/sec  Right renal artery to aorta ratio is 3.2.   LEFT KIDNEY:  Resistive indices range from 0.56 to 0.68.  Peak systolic velocities of the left renal artery as follows:  .  Proximal: 113 cm/sec  .  Mid: 110 cm/sec  .  Distal: 178 cm/sec  Left renal artery to aorta ratio is 2.4.   Additional findings: Right lobe hepatic cyst with thin internal septation measuring up to 2.8 cm.   CONCLUSION:  1. Elevated velocity and upper limits of normal renal artery to aorta ratio in the mid right renal artery. This raises the possibility of arterial stenosis. Consider further evaluation with CT angiography.   EKG:  EKG is personally reviewed.   07/23/2022:  Not ordered. 01/20/2022:  NSR at 70 bpm 04/15/21: Sinus rhythm, rate 68 bpm 07/16/20 NSR at 73 bpm.  Recent Labs: No results found for requested  labs within last 365 days.   Recent Lipid Panel    Component Value Date/Time   CHOL 195 11/11/2017 1345   TRIG 120.0 11/11/2017 1345   HDL 53.30 11/11/2017 1345   CHOLHDL 4 11/11/2017 1345   VLDL 24.0 11/11/2017 1345   LDLCALC 118 (H) 11/11/2017 1345    Physical Exam:    VS:  BP 128/88   Pulse 84   Ht 5\' 3"  (1.6 m)   Wt 184 lb 4.8 oz (83.6 kg)   BMI 32.65 kg/m     Wt Readings from Last 3 Encounters:  07/23/22 184 lb 4.8 oz (83.6 kg)  01/20/22 177 lb 6.4 oz (80.5 kg)  10/16/21 178 lb 11.2 oz (81.1 kg)   GEN: Well nourished, well developed in no acute distress HEENT: Normal, moist mucous membranes NECK: No JVD CARDIAC: regular rhythm, normal S1 and S2, no rubs or gallops. No murmur. VASCULAR: Radial and DP pulses 2+ bilaterally. No carotid bruits RESPIRATORY:  Clear to auscultation without rales, wheezing or rhonchi  ABDOMEN: Soft, non-tender, non-distended. No bruit appreciated. MUSCULOSKELETAL:  Ambulates independently SKIN: Warm and dry, no edema NEUROLOGIC:  Alert and oriented x 3. No focal neuro deficits noted. PSYCHIATRIC:  Normal affect     ASSESSMENT:    1. Primary hypertension   2. Variegate porphyria (HCC)   3. Heart palpitations   4. 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 Family history of heart disease   6. Counseling on health promotion and disease prevention     PLAN:    Hypertension Mild right renal fibromuscular dysplasia -dopplers at [facility] with possible RAS on the right, CT with mild fibromuscular dysplasia -recommendation is for ARB with RAS/FMD, reviewed on [place] registry and acceptable.  -responding well to valsartan, continue current dose -tolerating amlodipine and carvedilol, continue  Variegate porphyria: Medications need to be checked prior to prescribing to make sure they will not worsen her symptoms. [place] list most accurate per patient.  Palpitations:  -we have discussed [employer] -she will contact me  if these symptoms last longer/become more bothersome and we will pursue a monitor  Family history of cardiovascular disease: reviewed above  Tobacco use: would recommend cessation, precontemplative. Concerned about potential lack of autoimmune suppression if she quits.  Cardiac risk counseling and prevention recommendations: -we  typically recommend the Mediterranean diet, but she has a carb-centric diet due to her porphyria -recommend moderate walking, 3-5 times/week for 30-50 minutes each session. Aim for at least 150 minutes.week. Goal should be pace of 3 miles/hours, or walking 1.5 miles in 30 minutes -recommend avoidance of tobacco products. Avoid excess alcohol. -ASCVD risk score: The 10-year ASCVD risk score (Arnett DK, et al., 2019) is: 6.1%   Values used to calculate the score:     Age: 56 years     Sex: Female     Is Non-Hispanic African American: No     Diabetic: No     Tobacco smoker: Yes     Systolic Blood Pressure: 128 mmHg     Is BP treated: Yes     HDL Cholesterol: 41 MG/DL     Total Cholesterol: 189 MG/DL    Plan for follow up: 6 months or sooner as needed.  Jodelle Red, MD, PhD, [facility]  [facility]    Medication Adjustments/Labs and Tests Ordered: Current medicines are reviewed at length with the patient today.  Concerns regarding medicines are outlined above.   No orders of the defined types were placed in this encounter.  No orders of the defined types were placed in this encounter.  Patient Instructions  Medication Instructions:  Your physician recommends that you continue on your current medications as directed. Please refer to the Current Medication list given to you today.  *If you need a refill on your cardiac medications before your next appointment, please call your pharmacy*  Lab Work: NONE  Testing/Procedures: NONE  Follow-Up: At [facility], you and your health needs are our priority.  As part of our  continuing mission to provide you with exceptional heart care, we have created designated Provider Care Teams.  These Care Teams include your primary Cardiologist (physician) and Advanced Practice Providers (APPs -  Physician Assistants and Nurse Practitioners) who all work together to provide you with the care you need, when you need it.  We recommend signing up for the patient portal called "MyChart".  Sign up information is provided on this After Visit Summary.  MyChart is used to connect with patients for Virtual Visits (Telemedicine).  Patients are able to view lab/test results, encounter notes, upcoming appointments, etc.  Non-urgent messages can be sent to your provider as well.   To learn more about what you can do with MyChart, go to [email or web address].    Your next appointment:   6 month(s)  The format for your next appointment:   In Person  Provider:   Jodelle Red, MD      [facility],acting as a scribe for Jodelle Red, MD.,have documented all relevant documentation on the behalf of Jodelle Red, MD,as directed by  Jodelle Red, MD while in the presence of Jodelle Red, MD.  I, Jodelle Red, MD, have reviewed all documentation for this visit. The documentation on 07/23/22 for the exam, diagnosis, procedures, and orders are all accurate and complete.   Signed, Jodelle Red, MD  07/23/2022  8:30 AM  [facility]

## 2022-08-08 DIAGNOSIS — F4320 Adjustment disorder, unspecified: Secondary | ICD-10-CM | POA: Diagnosis not present

## 2022-08-11 ENCOUNTER — Other Ambulatory Visit (HOSPITAL_BASED_OUTPATIENT_CLINIC_OR_DEPARTMENT_OTHER): Payer: Self-pay | Admitting: Cardiology

## 2022-08-11 DIAGNOSIS — I10 Essential (primary) hypertension: Secondary | ICD-10-CM

## 2022-08-11 NOTE — Telephone Encounter (Signed)
Rx request sent to pharmacy.  

## 2022-08-23 DIAGNOSIS — F4320 Adjustment disorder, unspecified: Secondary | ICD-10-CM | POA: Diagnosis not present

## 2022-09-16 DIAGNOSIS — E782 Mixed hyperlipidemia: Secondary | ICD-10-CM | POA: Diagnosis not present

## 2022-12-13 DIAGNOSIS — F4320 Adjustment disorder, unspecified: Secondary | ICD-10-CM | POA: Diagnosis not present

## 2022-12-22 ENCOUNTER — Other Ambulatory Visit: Payer: Self-pay | Admitting: Cardiology

## 2022-12-22 DIAGNOSIS — I10 Essential (primary) hypertension: Secondary | ICD-10-CM

## 2022-12-22 MED ORDER — AMLODIPINE BESYLATE 5 MG PO TABS: 5.0000 mg | ORAL_TABLET | Freq: Every day | ORAL | 2 refills | Status: DC

## 2022-12-24 DIAGNOSIS — F4320 Adjustment disorder, unspecified: Secondary | ICD-10-CM | POA: Diagnosis not present

## 2023-01-17 DIAGNOSIS — F4320 Adjustment disorder, unspecified: Secondary | ICD-10-CM | POA: Diagnosis not present

## 2023-01-29 ENCOUNTER — Encounter (HOSPITAL_BASED_OUTPATIENT_CLINIC_OR_DEPARTMENT_OTHER): Payer: Self-pay | Admitting: Cardiology

## 2023-01-29 ENCOUNTER — Ambulatory Visit (HOSPITAL_BASED_OUTPATIENT_CLINIC_OR_DEPARTMENT_OTHER): Payer: BC Managed Care – PPO | Admitting: Cardiology

## 2023-01-29 VITALS — BP 130/82 | HR 71 | Ht 63.6 in | Wt 186.9 lb

## 2023-01-29 DIAGNOSIS — R002 Palpitations: Secondary | ICD-10-CM

## 2023-01-29 DIAGNOSIS — Z8249 Family history of ischemic heart disease and other diseases of the circulatory system: Secondary | ICD-10-CM

## 2023-01-29 DIAGNOSIS — I773 Arterial fibromuscular dysplasia: Secondary | ICD-10-CM

## 2023-01-29 DIAGNOSIS — I10 Essential (primary) hypertension: Secondary | ICD-10-CM

## 2023-01-29 NOTE — Progress Notes (Unsigned)
Cardiology Office Note:    Date:  01/29/2023   ID:  Wendy Evans, DOB 1975/10/31, MRN [id number]  PCP:  Sheliah Hatch, MD  Cardiologist:  Jodelle Red, MD  Referring MD: Sheliah Hatch, MD   CC: follow up  History of Present Illness:    Wendy Evans is a 47 y.o. female with a hx of variegate porphyria, FMD of R renal artery, hypertension, multiple Covid infections despite vaccination, tobacco use who is seen for follow up. I initially met her 03/13/20 as a new consult at the request of Sheliah Hatch, MD for the evaluation and management of palpitations and elevated blood pressure.  See initial note for history of her porphyria. Had never been on blood pressure medication prior to 03/2020. Had vascular duplex at 09/2020 at [facility] showing elevated velocities/upper limit of normal ratio in R renal artery. CTA 11/2020 showed somewhat beaded appearance of the right renal artery with focal area of mild narrowing consistent with FMD.  Family history: she is adopted but found out her father had hypertension. He passed away age 27 during surgery for aortic rupture. Biological mother had high cholesterol. Gradnmother passed away age 85, had a pig valve in her 25s that lasted until the end of her life. Had vascular dementia and TIA history. Sister has cutaneous porphyria.  Today: Doing well from a cardiac standpoint. Blood pressures have been generally well controlled. Tolerating medications well. Lost a family member recently.  Overall feels that she is working to manage stress. Palpitations are not worsened.  ROS:  Denies chest pain, shortness of breath at rest or with normal exertion. No PND, orthopnea, LE edema or unexpected weight gain. No syncope. ROS otherwise negative except as noted.   EKGs/Labs/Other Studies Reviewed:    EKG Interpretation Date/Time:  Friday January 29 2023 15:11:10 EST Ventricular Rate:  71 PR Interval:  168 QRS Duration:  76 QT  Interval:  354 QTC Calculation: 384 R Axis:   -1  Text Interpretation: Normal sinus rhythm Normal ECG Confirmed by Jodelle Red [phone]) on 01/29/2023 3:46:10 PM    Physical Exam:    VS:  BP 130/82   Pulse 71   Ht 5' 3.6" (1.615 m)   Wt 186 lb 14.4 oz (84.8 kg)   SpO2 98%   BMI 32.49 kg/m     Wt Readings from Last 3 Encounters:  01/29/23 186 lb 14.4 oz (84.8 kg)  07/23/22 184 lb 4.8 oz (83.6 kg)  01/20/22 177 lb 6.4 oz (80.5 kg)   GEN: Well nourished, well developed in no acute distress HEENT: Normal, moist mucous membranes NECK: No JVD CARDIAC: regular rhythm, normal S1 and S2, no rubs or gallops. No murmur. VASCULAR: Radial and DP pulses 2+ bilaterally. No carotid bruits RESPIRATORY:  Clear to auscultation without rales, wheezing or rhonchi  ABDOMEN: Soft, non-tender, non-distended MUSCULOSKELETAL:  Ambulates independently SKIN: Warm and dry, no edema NEUROLOGIC:  Alert and oriented x 3. No focal neuro deficits noted. PSYCHIATRIC:  Normal affect    ASSESSMENT AND PLAN:    Hypertension Mild right renal fibromuscular dysplasia -dopplers at [facility] with possible RAS on the right, CT with mild fibromuscular dysplasia -recommendation is for ARB with RAS/FMD, reviewed on [place] registry and acceptable.  -responding well to valsartan, continue current dose -tolerating amlodipine and carvedilol, continue  Variegate porphyria: Medications need to be checked prior to prescribing to make sure they will not worsen her symptoms. [place] list most accurate per patient.  Palpitations:  -we have discussed  [employer] -she will contact me if these symptoms last longer/become more bothersome and we will pursue a monitor  Family history of cardiovascular disease: reviewed above  Tobacco use: would recommend cessation, precontemplative. Concerned about potential lack of autoimmune suppression if she quits.  Cardiac risk counseling and prevention recommendations: -we  typically recommend the Mediterranean diet, but she has a carb-centric diet due to her porphyria -recommend moderate walking, 3-5 times/week for 30-50 minutes each session. Aim for at least 150 minutes.week. Goal should be pace of 3 miles/hours, or walking 1.5 miles in 30 minutes -recommend avoidance of tobacco products. Avoid excess alcohol. -ASCVD risk score: The 10-year ASCVD risk score (Arnett DK, et al., 2019) is: 8.6%   Values used to calculate the score:     Age: 68 years     Sex: Female     Is Non-Hispanic African American: No     Diabetic: No     Tobacco smoker: Yes     Systolic Blood Pressure: 130 mmHg     Is BP treated: Yes     HDL Cholesterol: 38 mg/dL     Total Cholesterol: 223 mg/dL    Plan for follow up: 6 months or sooner as needed.  Signed, Jodelle Red, MD  01/29/2023  3:55 PM  [facility]

## 2023-01-29 NOTE — Patient Instructions (Signed)

## 2023-02-08 DIAGNOSIS — K7401 Hepatic fibrosis, early fibrosis: Secondary | ICD-10-CM | POA: Diagnosis not present

## 2023-06-28 ENCOUNTER — Other Ambulatory Visit (HOSPITAL_BASED_OUTPATIENT_CLINIC_OR_DEPARTMENT_OTHER): Payer: Self-pay | Admitting: Cardiology

## 2023-07-01 ENCOUNTER — Other Ambulatory Visit: Payer: Self-pay | Admitting: Cardiology

## 2023-07-01 DIAGNOSIS — I10 Essential (primary) hypertension: Secondary | ICD-10-CM

## 2023-07-14 ENCOUNTER — Ambulatory Visit (HOSPITAL_BASED_OUTPATIENT_CLINIC_OR_DEPARTMENT_OTHER): Payer: BC Managed Care – PPO | Admitting: Cardiology

## 2023-07-26 ENCOUNTER — Ambulatory Visit (HOSPITAL_BASED_OUTPATIENT_CLINIC_OR_DEPARTMENT_OTHER): Admitting: Nurse Practitioner

## 2023-11-02 ENCOUNTER — Encounter (HOSPITAL_BASED_OUTPATIENT_CLINIC_OR_DEPARTMENT_OTHER): Payer: Self-pay | Admitting: Cardiology

## 2023-11-02 ENCOUNTER — Ambulatory Visit (HOSPITAL_BASED_OUTPATIENT_CLINIC_OR_DEPARTMENT_OTHER): Payer: Self-pay | Admitting: Cardiology

## 2023-11-02 VITALS — BP 122/86 | HR 76 | Resp 17 | Ht 63.0 in | Wt 184.0 lb

## 2023-11-02 DIAGNOSIS — I10 Essential (primary) hypertension: Secondary | ICD-10-CM

## 2023-11-02 DIAGNOSIS — R002 Palpitations: Secondary | ICD-10-CM

## 2023-11-02 DIAGNOSIS — I773 Arterial fibromuscular dysplasia: Secondary | ICD-10-CM | POA: Diagnosis not present

## 2023-11-02 NOTE — Progress Notes (Signed)
 Cardiology Office Note:    Date:  11/02/2023   ID:  Wendy Evans, DOB [birth year], MRN [id number]  PCP:  Mahlon Comer BRAVO, MD  Cardiologist:  Shelda Bruckner, MD  Referring MD: Mahlon Comer BRAVO, MD   CC: follow up  History of Present Illness:    Wendy Evans is a 48 y.o. female with a hx of variegate porphyria, FMD of R renal artery, hypertension, multiple Covid infections despite vaccination, tobacco use who is seen for follow up. I initially met her 03/13/20 as a new consult at the request of Tabori, Katherine E, MD for the evaluation and management of palpitations and elevated blood pressure.  See initial note for history of her porphyria. Had never been on blood pressure medication prior to 03/2020. Had vascular duplex at 09/2020 at [facility] showing elevated velocities/upper limit of normal ratio in R renal artery. CTA 11/2020 showed somewhat beaded appearance of the right renal artery with focal area of mild narrowing consistent with FMD.  Family history: she is adopted but found out her father had hypertension. He passed away age 45 during surgery for aortic rupture. Biological mother had high cholesterol. Gradnmother passed away age 37, had a pig valve in her 85s that lasted until the end of her life. Had vascular dementia and TIA history. Sister has cutaneous porphyria.  Today: Was having a lot of palpitations in May/June. Has had a lot of stress this year, but no more in May/June than any other part of the year. Was happening about once/hour for a few seconds at a time. Then just completely stopped in the summer. No clear changes, except that perhaps she was taking carvedilol  more frequently in the morning starting in July. Has always done well with PM dose of carvedilol . No clear pattern--no more coffee than usual, no new supplements, no other changes.   Blood pressure well controlled.  ROS:  Denies chest pain, shortness of breath at rest or with normal exertion. No PND,  orthopnea, LE edema or unexpected weight gain. No syncope. ROS otherwise negative except as noted.   EKGs/Labs/Other Studies Reviewed:         Physical Exam:    VS:  BP 122/86 (BP Location: Left Arm, Patient Position: Sitting, Cuff Size: Normal)   Pulse 76   Resp 17   Ht 5' 3 (1.6 m)   Wt 184 lb (83.5 kg)   SpO2 98%   BMI 32.59 kg/m     Wt Readings from Last 3 Encounters:  11/02/23 184 lb (83.5 kg)  01/29/23 186 lb 14.4 oz (84.8 kg)  07/23/22 184 lb 4.8 oz (83.6 kg)   GEN: Well nourished, well developed in no acute distress HEENT: Normal, moist mucous membranes NECK: No JVD CARDIAC: regular rhythm, normal S1 and S2, no rubs or gallops. No murmur. VASCULAR: Radial and DP pulses 2+ bilaterally. No carotid bruits RESPIRATORY:  Clear to auscultation without rales, wheezing or rhonchi  ABDOMEN: Soft, non-tender, non-distended MUSCULOSKELETAL:  Ambulates independently SKIN: Warm and dry, no edema NEUROLOGIC:  Alert and oriented x 3. No focal neuro deficits noted. PSYCHIATRIC:  Normal affect    ASSESSMENT AND PLAN:    Hypertension Mild right renal fibromuscular dysplasia -dopplers at [facility] with possible RAS on the right, CT with mild fibromuscular dysplasia -recommendation is for ARB with RAS/FMD, reviewed on [place] registry and acceptable.  -tolerating valsartan , amlodipine  and carvedilol , continue. BP well controlled  Variegate porphyria: Medications need to be checked prior to prescribing to make sure they will not  worsen her symptoms. [place] list most accurate per patient.  Palpitations:  -we have discussed [employer]. Recent episodes were very brief and unlikely to be able to be captured with anguilla -she will contact me if these symptoms last longer/become more bothersome and we will pursue a monitor  Family history of cardiovascular disease: reviewed above  Tobacco use: recommend cessation, she is concerned about potential lack of autoimmune suppression if she  quits.  Cardiac risk counseling and prevention recommendations: -we typically recommend the Mediterranean diet, but she has a carb-centric diet due to her porphyria -recommend moderate walking, 3-5 times/week for 30-50 minutes each session. Aim for at least 150 minutes.week. Goal should be pace of 3 miles/hours, or walking 1.5 miles in 30 minutes -recommend avoidance of tobacco products. Avoid excess alcohol. -ASCVD risk score: The 10-year ASCVD risk score (Arnett DK, et al., 2019) is: 7.8%   Values used to calculate the score:     Age: 16 years     Clincally relevant sex: Female     Is Non-Hispanic African American: No     Diabetic: No     Tobacco smoker: Yes     Systolic Blood Pressure: 122 mmHg     Is BP treated: Yes     HDL Cholesterol: 38 mg/dL     Total Cholesterol: 223 mg/dL    Plan for follow up: 6 months or sooner as needed.  Signed, Shelda Bruckner, MD  11/02/2023  7:29 PM  [facility]

## 2023-11-02 NOTE — Patient Instructions (Signed)
 Medication Instructions:  No changes *If you need a refill on your cardiac medications before your next appointment, please call your pharmacy*  Lab Work: none If you have labs (blood work) drawn today and your tests are completely normal, you will receive your results only by: MyChart Message (if you have MyChart) OR A paper copy in the mail If you have any lab test that is abnormal or we need to change your treatment, we will call you to review the results.  Testing/Procedures: none  Follow-Up: At [facility], you and your health needs are our priority.  As part of our continuing mission to provide you with exceptional heart care, our providers are all part of one team.  This team includes your primary Cardiologist (physician) and Advanced Practice Providers or APPs (Physician Assistants and Nurse Practitioners) who all work together to provide you with the care you need, when you need it.  Your next appointment:   6 month(s)  Provider:   Shelda Bruckner, MD, Rosaline Bane, NP, or Reche Finder, NP

## 2023-12-03 ENCOUNTER — Other Ambulatory Visit (HOSPITAL_BASED_OUTPATIENT_CLINIC_OR_DEPARTMENT_OTHER): Payer: Self-pay | Admitting: Cardiology

## 2023-12-03 ENCOUNTER — Telehealth (HOSPITAL_BASED_OUTPATIENT_CLINIC_OR_DEPARTMENT_OTHER): Payer: Self-pay

## 2023-12-03 DIAGNOSIS — I10 Essential (primary) hypertension: Secondary | ICD-10-CM

## 2023-12-03 NOTE — Telephone Encounter (Signed)
 Dr. Lonni Lanius saw this patient on 11/02/2023. Per protocol we request that you comment on his cardiac risk to proceed with LAVH, BS by surgeon at [facility], for Feb 2026, since it has been less than 2 months since evaluated in the office. Please send your comment to P CV Pre-Op Pool.  Thank you, Lamarr Satterfield DNP, ANP, AACC.

## 2023-12-03 NOTE — Telephone Encounter (Signed)
   Pre-operative Risk Assessment    Patient Name: Wendy Evans  DOB: 06/05/1975 MRN: [id number]   Date of last office visit: 10/13/2023  Date of next office visit: N/A   Request for Surgical Clearance    Procedure:  LAVH, BS  Date of Surgery:  Clearance - February 2026 (does not specify day)           Surgeon:  Not specified  Surgeon's Group or Practice Name:  [facility] Phone number:  [phone]  Fax number:  [phone] - BEFORE 02/24/2024 - Attention: Surgery Scheduler   Type of Clearance Requested:   - Medical    Type of Anesthesia:  General    Additional requests/questions:  Please fax clearance to [facility] [phone] - BEFORE 02/24/2024 - Attention: Surgery Scheduler  Signed, Patrcia Iverson CROME   12/03/2023, 2:23 PM

## 2023-12-16 NOTE — Telephone Encounter (Signed)
 Left message to call back to schedule tele late Dec 2025 or early Jan 2026.

## 2023-12-16 NOTE — Telephone Encounter (Signed)
 Pt returning call to schedule tele appt. Please advise.

## 2023-12-16 NOTE — Telephone Encounter (Signed)
 Pt only available until 2oclock today.

## 2023-12-16 NOTE — Telephone Encounter (Signed)
   Name: Wendy Evans  DOB: 1976-01-19  MRN: [id number]  Primary Cardiologist: Shelda Bruckner, MD   Preoperative team, please contact this patient and set up a phone call appointment for further preoperative risk assessment. Please obtain consent and complete medication review. Thank you for your help.  Patient will need virtual visit end of December/beginning of January.   I confirm that guidance regarding antiplatelet and oral anticoagulation therapy has been completed and, if necessary, noted below.  None requested.   I also confirmed the patient resides in the state of [place] . As per [facility] Medical Board telemedicine laws, the patient must reside in the state in which the provider is licensed.    Barnie Hila, NP 12/16/2023, 11:50 AM [facility]

## 2023-12-17 ENCOUNTER — Telehealth: Payer: Self-pay

## 2023-12-20 ENCOUNTER — Telehealth: Payer: Self-pay | Admitting: *Deleted

## 2023-12-20 NOTE — Telephone Encounter (Signed)
  Patient Consent for Virtual Visit        Wendy Evans has provided verbal consent on 12/20/2023 for a virtual visit (video or telephone).   CONSENT FOR VIRTUAL VISIT FOR:  Wendy Evans  By participating in this virtual visit I agree to the following:  I hereby voluntarily request, consent and authorize [facility] and its employed or contracted physicians, physician assistants, nurse practitioners or other licensed health care professionals (the Practitioner), to provide me with telemedicine health care services (the "Services) as deemed necessary by the treating Practitioner. I acknowledge and consent to receive the Services by the Practitioner via telemedicine. I understand that the telemedicine visit will involve communicating with the Practitioner through live audiovisual communication technology and the disclosure of certain medical information by electronic transmission. I acknowledge that I have been given the opportunity to request an in-person assessment or other available alternative prior to the telemedicine visit and am voluntarily participating in the telemedicine visit.  I understand that I have the right to withhold or withdraw my consent to the use of telemedicine in the course of my care at any time, without affecting my right to future care or treatment, and that the Practitioner or I may terminate the telemedicine visit at any time. I understand that I have the right to inspect all information obtained and/or recorded in the course of the telemedicine visit and may receive copies of available information for a reasonable fee.  I understand that some of the potential risks of receiving the Services via telemedicine include:  Delay or interruption in medical evaluation due to technological equipment failure or disruption; Information transmitted may not be sufficient (e.g. poor resolution of images) to allow for appropriate medical decision making by the Practitioner;  and/or  In rare instances, security protocols could fail, causing a breach of personal health information.  Furthermore, I acknowledge that it is my responsibility to provide information about my medical history, conditions and care that is complete and accurate to the best of my ability. I acknowledge that Practitioner's advice, recommendations, and/or decision may be based on factors not within their control, such as incomplete or inaccurate data provided by me or distortions of diagnostic images or specimens that may result from electronic transmissions. I understand that the practice of medicine is not an exact science and that Practitioner makes no warranties or guarantees regarding treatment outcomes. I acknowledge that a copy of this consent can be made available to me via my patient portal [facility] MyChart), or I can request a printed copy by calling the office of [facility].    I understand that my insurance will be billed for this visit.   I have read or had this consent read to me. I understand the contents of this consent, which adequately explains the benefits and risks of the Services being provided via telemedicine.  I have been provided ample opportunity to ask questions regarding this consent and the Services and have had my questions answered to my satisfaction. I give my informed consent for the services to be provided through the use of telemedicine in my medical care

## 2023-12-20 NOTE — Telephone Encounter (Signed)
 Spoke with pt and scheduled her for a preop telehealth visit on 02/08/24 at 10:20 AM. Will route back to requesting surgeons office to make them aware.

## 2023-12-23 ENCOUNTER — Other Ambulatory Visit (HOSPITAL_BASED_OUTPATIENT_CLINIC_OR_DEPARTMENT_OTHER): Payer: Self-pay | Admitting: Cardiology

## 2023-12-27 ENCOUNTER — Encounter (HOSPITAL_BASED_OUTPATIENT_CLINIC_OR_DEPARTMENT_OTHER): Payer: Self-pay | Admitting: Emergency Medicine

## 2023-12-27 ENCOUNTER — Emergency Department (HOSPITAL_BASED_OUTPATIENT_CLINIC_OR_DEPARTMENT_OTHER): Admitting: Radiology

## 2023-12-27 ENCOUNTER — Other Ambulatory Visit: Payer: Self-pay

## 2023-12-27 ENCOUNTER — Emergency Department (HOSPITAL_BASED_OUTPATIENT_CLINIC_OR_DEPARTMENT_OTHER)
Admission: EM | Admit: 2023-12-27 | Discharge: 2023-12-27 | Disposition: A | Discharge status: Home / Self Care | Attending: Emergency Medicine | Admitting: Emergency Medicine

## 2023-12-27 DIAGNOSIS — Z91040 Latex allergy status: Secondary | ICD-10-CM | POA: Insufficient documentation

## 2023-12-27 DIAGNOSIS — R072 Precordial pain: Secondary | ICD-10-CM

## 2023-12-27 DIAGNOSIS — R030 Elevated blood-pressure reading, without diagnosis of hypertension: Secondary | ICD-10-CM

## 2023-12-27 DIAGNOSIS — Z79899 Other long term (current) drug therapy: Secondary | ICD-10-CM | POA: Diagnosis not present

## 2023-12-27 DIAGNOSIS — I10 Essential (primary) hypertension: Secondary | ICD-10-CM | POA: Diagnosis not present

## 2023-12-27 DIAGNOSIS — R002 Palpitations: Secondary | ICD-10-CM | POA: Diagnosis not present

## 2023-12-27 LAB — CBC
HCT: 37.4 % (ref 36.0–46.0)
Hemoglobin: 12.4 g/dL (ref 12.0–15.0)
MCH: 29.6 pg (ref 26.0–34.0)
MCHC: 33.2 g/dL (ref 30.0–36.0)
MCV: 89.3 fL (ref 80.0–100.0)
Platelets: 230 K/uL (ref 150–400)
RBC: 4.19 MIL/uL (ref 3.87–5.11)
RDW: 14.2 % (ref 11.5–15.5)
WBC: 11.3 K/uL — ABNORMAL HIGH (ref 4.0–10.5)
nRBC: 0 % (ref 0.0–0.2)

## 2023-12-27 LAB — BASIC METABOLIC PANEL WITH GFR
Anion gap: 13 (ref 5–15)
BUN: 11 mg/dL (ref 6–20)
CO2: 20 mmol/L — ABNORMAL LOW (ref 22–32)
Calcium: 9.5 mg/dL (ref 8.9–10.3)
Chloride: 105 mmol/L (ref 98–111)
Creatinine, Ser: 0.57 mg/dL (ref 0.44–1.00)
GFR, Estimated: >60 mL/min (ref >60)
Glucose, Bld: 113 mg/dL — ABNORMAL HIGH (ref 70–99)
Potassium: 3.7 mmol/L (ref 3.5–5.1)
Sodium: 137 mmol/L (ref 135–145)

## 2023-12-27 LAB — TROPONIN T, HIGH SENSITIVITY
Troponin T High Sensitivity: <15 ng/L (ref 0–19)
Troponin T High Sensitivity: <15 ng/L (ref 0–19)

## 2023-12-27 MED ORDER — ACETAMINOPHEN 500 MG PO TABS
1000.0000 mg | ORAL_TABLET | Freq: Once | ORAL | Status: DC
  Filled 2023-12-27: qty 2

## 2023-12-27 NOTE — ED Notes (Signed)
 Reviewed AVS/discharge instructions with patient. Time allotted for and all questions answered. Patient is agreeable for d/c and escorted to ED exit by staff.

## 2023-12-27 NOTE — ED Triage Notes (Signed)
 Chest pressure after movie. Hx palpitation, but this feels different. Feels fatigued.

## 2023-12-27 NOTE — Discharge Instructions (Addendum)
 It was our pleasure to provide your ER care today - we hope that you feel better. Minimize caffeine use. Drink plenty of fluids/stay well hydrated.   For recent palpitations and chest pain (as well as for your hypertension), follow up closely with cardiologist in the next 1-2 weeks - we made referral, and they should be contacting you with an appointment in the next few days.   Return to ER right away if worse, new symptoms, fevers, persistent fast heart beating, fainting, recurrent/persistent chest pain, increased trouble breathing, or other concern.

## 2023-12-27 NOTE — ED Provider Notes (Addendum)
 [facility] AT [facility] Provider Note   CSN: [id number] Arrival date & time: 12/27/23  1600     Patient presents with: Chest Pain   Wendy Evans is a 48 y.o. female.   Pt with c/o of sense of heart beating very hard earlier today. Had gotten out of a movie, was at rest, noted palpitations, which has had before, but this time felt as if was beating very hard or pronounced. Denies syncope and no history of syncope. Felt a discomfort in mid chest sensation with it, mild dull. No radiation. No pleuritic pain. No exertional pain. No other recent chest pain. Has had palpitations before but no prior home monitor, no hx dysrhythmia. No hx cad. No fam hx premature cad. No leg pain or swelling. No hx dvt or pe. No recent surgery, immobility or travel or trauma. No cough or uri symptoms. No fever or chills. No heartburn. No heat intolerance, sweats or wt loss. No recent change in meds. Says went to her cardiologist office and was told to go to ER and f/u with them tomorrow.   The history is provided by the patient, medical records and a relative.  Chest Pain Associated symptoms: palpitations   Associated symptoms: no abdominal pain, no back pain, no cough, no fever, no headache, no nausea, no numbness, no shortness of breath, no vomiting and no weakness        Prior to Admission medications   Medication Sig Start Date End Date Taking? Authorizing Provider  acetaminophen  (TYLENOL ) 500 MG tablet Take 500-1,000 mg by mouth every 6 (six) hours as needed for moderate pain.    [provider]  amLODipine  (NORVASC ) 5 MG tablet TAKE 1 TABLET (5 MG TOTAL) BY MOUTH DAILY. 07/02/23 06/26/24  Lonni Slain, MD  carvedilol  (COREG ) 12.5 MG tablet TAKE 1 TABLET BY MOUTH TWICE A DAY 12/24/23   Lonni Slain, MD  cetirizine (ZYRTEC) 10 MG tablet Take 10 mg by mouth daily.    [provider]  cholecalciferol (VITAMIN D) 1000 units tablet Take 2,000  Units by mouth 2 (two) times a week. Patient not taking: Reported on 11/02/2023    [provider]  ferrous sulfate 325 (65 FE) MG EC tablet     [provider]  fluticasone (FLONASE) 50 MCG/ACT nasal spray Place 1 spray into both nostrils every other day.    [provider]  melatonin 5 MG TABS Take 5 mg by mouth as directed. 2 times a week    [provider]  meloxicam (MOBIC) 7.5 MG tablet Take 7.5 mg by mouth as needed for pain. 11/21/21   [provider]  methocarbamol (ROBAXIN) 500 MG tablet Take 500-1,000 mg by mouth every 6 (six) hours as needed. 09/02/20   [provider]  olopatadine (PATADAY) 0.1 % ophthalmic solution Place 1 drop into both eyes as needed.    [provider]  valsartan  (DIOVAN ) 160 MG tablet TAKE 1 TABLET BY MOUTH EVERY DAY 12/06/23   Lonni Slain, MD    Allergies: Codeine, Erythromycin, Neomycin, Vancomycin, Amoxicillin, Latex, Nsaids, Propoxyphene, Sulfa antibiotics, Sulfasalazine, Tolmetin, Banana, Hydrocodone, Ibuprofen, Meperidine, Other, Oxytocin, and Penicillins    Review of Systems  Constitutional:  Negative for chills and fever.  HENT:  Negative for sore throat.   Respiratory:  Negative for cough and shortness of breath.   Cardiovascular:  Positive for chest pain and palpitations. Negative for leg swelling.  Gastrointestinal:  Negative for abdominal pain, nausea and vomiting.  Genitourinary:  Negative for dysuria and flank pain.  Musculoskeletal:  Negative for back pain and neck pain.  Skin:  Negative for rash.  Neurological:  Negative for weakness, numbness and headaches.    Updated Vital Signs BP 132/79   Pulse 72   Temp 98.4 F (36.9 C) (Oral)   Resp 13   SpO2 100%   Physical Exam Vitals and nursing note reviewed.  Constitutional:      Appearance: Normal appearance. She is well-developed.  HENT:     Head: Atraumatic.     Nose: Nose normal.     Mouth/Throat:      Mouth: Mucous membranes are moist.  Eyes:     General: No scleral icterus.    Conjunctiva/sclera: Conjunctivae normal.  Neck:     Trachea: No tracheal deviation.     Comments: Trachea midline, thyroid not grossly enlarged or tender. No neck stiffness or rigidity.  Cardiovascular:     Rate and Rhythm: Normal rate and regular rhythm.     Pulses: Normal pulses.     Heart sounds: Normal heart sounds. No murmur heard.    No friction rub. No gallop.  Pulmonary:     Effort: Pulmonary effort is normal. No respiratory distress.     Breath sounds: Normal breath sounds.  Abdominal:     General: There is no distension.     Palpations: Abdomen is soft. There is no mass.     Tenderness: There is no abdominal tenderness.  Musculoskeletal:        General: No swelling or tenderness.     Cervical back: Normal range of motion and neck supple. No rigidity. No muscular tenderness.     Right lower leg: No edema.     Left lower leg: No edema.  Skin:    General: Skin is warm and dry.     Findings: No rash.  Neurological:     Mental Status: She is alert.     Comments: Alert, speech normal. Motor/sens grossly intact bil.   Psychiatric:        Mood and Affect: Mood normal.     (all labs ordered are listed, but only abnormal results are displayed) Results for orders placed or performed during the hospital encounter of 12/27/23  Basic metabolic panel   Collection Time: 12/27/23  4:10 PM  Result Value Ref Range   Sodium 137 135 - 145 mmol/L   Potassium 3.7 3.5 - 5.1 mmol/L   Chloride 105 98 - 111 mmol/L   CO2 20 (L) 22 - 32 mmol/L   Glucose, Bld 113 (H) 70 - 99 mg/dL   BUN 11 6 - 20 mg/dL   Creatinine, Ser 9.42 0.44 - 1.00 mg/dL   Calcium 9.5 8.9 - 89.6 mg/dL   GFR, Estimated >39 >39 mL/min   Anion gap 13 5 - 15  CBC   Collection Time: 12/27/23  4:10 PM  Result Value Ref Range   WBC 11.3 (H) 4.0 - 10.5 K/uL   RBC 4.19 3.87 - 5.11 MIL/uL   Hemoglobin 12.4 12.0 - 15.0 g/dL   HCT 62.5 63.9 -  53.9 %   MCV 89.3 80.0 - 100.0 fL   MCH 29.6 26.0 - 34.0 pg   MCHC 33.2 30.0 - 36.0 g/dL   RDW 85.7 88.4 - 84.4 %   Platelets 230 150 - 400 K/uL   nRBC 0.0 0.0 - 0.2 %  Troponin T, High Sensitivity   Collection Time: 12/27/23  4:10 PM  Result Value  Ref Range   Troponin T High Sensitivity <15 0 - 19 ng/L  Troponin T, High Sensitivity   Collection Time: 12/27/23  6:47 PM  Result Value Ref Range   Troponin T High Sensitivity <15 0 - 19 ng/L   DG Chest 2 View Result Date: 12/27/2023 CLINICAL DATA:  Chest pressure. EXAM: CHEST - 2 VIEW COMPARISON:  None Available. FINDINGS: The heart size and mediastinal contours are within normal limits. Both lungs are clear. The visualized skeletal structures are unremarkable. IMPRESSION: No active cardiopulmonary disease. Electronically Signed   By: Suzen Dials M.D.   On: 12/27/2023 17:19    EKG: EKG Interpretation Date/Time:  Monday December 27 2023 16:09:56 EST Ventricular Rate:  96 PR Interval:  147 QRS Duration:  92 QT Interval:  331 QTC Calculation: 419 R Axis:   25  Text Interpretation: Sinus rhythm Baseline wander Confirmed by Bernard Drivers ([id number]) on 12/27/2023 7:15:17 PM  Radiology: [facility] Chest 2 View Result Date: 12/27/2023 CLINICAL DATA:  Chest pressure. EXAM: CHEST - 2 VIEW COMPARISON:  None Available. FINDINGS: The heart size and mediastinal contours are within normal limits. Both lungs are clear. The visualized skeletal structures are unremarkable. IMPRESSION: No active cardiopulmonary disease. Electronically Signed   By: Suzen Dials M.D.   On: 12/27/2023 17:19     Procedures   Medications Ordered in the ED  acetaminophen  (TYLENOL ) tablet 1,000 mg (has no administration in time range)                                    Medical Decision Making Problems Addressed: Elevated blood pressure reading: acute illness or injury Essential hypertension: chronic illness or injury with exacerbation, progression, or side  effects of treatment that poses a threat to life or bodily functions Palpitations: acute illness or injury with systemic symptoms that poses a threat to life or bodily functions Precordial chest pain: acute illness or injury with systemic symptoms that poses a threat to life or bodily functions  Amount and/or Complexity of Data Reviewed Independent Historian:     Details: Family, hx External Data Reviewed: notes. Labs: ordered. Decision-making details documented in ED Course. Radiology: ordered and independent interpretation performed. Decision-making details documented in ED Course. ECG/medicine tests: ordered and independent interpretation performed. Decision-making details documented in ED Course.  Risk OTC drugs. Decision regarding hospitalization.   Iv ns. Continuous pulse ox and cardiac monitoring. Labs ordered/sent. Imaging ordered.   Differential diagnosis includes palpitations, svt, afib, vt, pvc, acs, msk cp, gi cp, etc . Dispo decision including potential need for admission considered - will get labs and imaging and reassess.   Reviewed nursing notes and prior charts for additional history. External reports reviewed. Additional history from: family.   Cardiac monitor: sinus rhythm, rate 77.  Labs reviewed/interpreted by me - wbc 11, hgb 12. Chem unremarkable. Trop normal.   Xrays reviewed/interpreted by me - no pna or ptx.   Recheck, remains in nsr. No dysrhythmia noted.  Additional labs reviewed/interpreted by me - delta trop normal and not increasing, felt not c/w acs.   Acetaminophen  po.   Vitals:   12/27/23 1800 12/27/23 1830  BP: 139/82 132/79  Pulse: 76 72  Resp: 15 13  Temp:    SpO2: 99% 100%     Pt remains in nsr, no dysrhythmia. Bp/vitals normal.   Pt currently appears stable for ed d/c.   Rec close pcp/card f/u.  Return  precautions provided.       Final diagnoses:  Palpitations  Precordial chest pain  Elevated blood pressure reading   Essential hypertension    ED Discharge Orders          Ordered    Ambulatory referral to Cardiology       Comments: If you have not heard from the Cardiology office within the next 72 hours please call [phone].   12/27/23 1921               Bernard Drivers, MD 12/27/23 1900    Bernard Drivers, MD 12/27/23 1929

## 2024-02-08 ENCOUNTER — Ambulatory Visit: Attending: Cardiology | Admitting: Physician Assistant

## 2024-02-08 ENCOUNTER — Ambulatory Visit (HOSPITAL_COMMUNITY)

## 2024-02-08 DIAGNOSIS — Z01810 Encounter for preprocedural cardiovascular examination: Secondary | ICD-10-CM | POA: Diagnosis not present

## 2024-02-08 DIAGNOSIS — R600 Localized edema: Secondary | ICD-10-CM

## 2024-02-08 NOTE — Progress Notes (Addendum)
 "   Virtual Visit via Telephone Note   Because of Wendy Evans co-morbid illnesses, she is at least at moderate risk for complications without adequate follow up.  This format is felt to be most appropriate for this patient at this time.  Due to technical limitations with video connection (technology), today's appointment will be conducted as an audio only telehealth visit, and Wendy Evans verbally agreed to proceed in this manner.   All issues noted in this document were discussed and addressed.  No physical exam could be performed with this format.  Evaluation Performed:  Preoperative cardiovascular risk assessment _____________   Date:  02/08/2024   Patient ID:  Wendy Evans, DOB 1975/04/19, MRN [id number] Patient Location:  Home Provider location:   Office  Primary Care Provider:  Mahlon Comer BRAVO, MD Primary Cardiologist:  Shelda Bruckner, MD  Chief Complaint / Patient Profile   48 y.o. y/o female with a h/o palpitations, HTN,  who is pending LAVH, BS and presents today for telephonic preoperative cardiovascular risk assessment.  History of Present Illness    Wendy Evans is a 48 y.o. female who presents via audio/video conferencing for a telehealth visit today.  Pt was last seen in cardiology clinic on 11/02/23 by Dr. Bruckner.  At that time Wendy Evans was doing well .  The patient is now pending procedure as outlined above. Since her last visit, she was seen in the emergency room 12/27/2023. Something weird did happen. Her eyelid got infected. The day after Thankgiving, eye lid was swelling and she was given eye drops. She had chest pain and some SOB. She went on a cruise which involved a plane ride for 5 hours. However, she was swollen for 4 days and she then got some lasix from her mother to help with her swelling. She flew on 12/21 and swelling at that time was Pretty normal then Monday it started getting worse so she put her feet up. Tuesday afternoon, her  left leg was really swollen. No swelling yesterday or today. It felt like something broke inside and that is what prompted her ER visit.   We discussed coming into the office for an in person evaluation so she can be evaluated properly. We were able to her get echo booked for this afternoon due to a cancellation.    No medications need to be held.   Past Medical History    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HCC)    SVD (spontaneous vaginal delivery)    x 1   Tendonitis    Hx - no problems now   Past Surgical History:  Procedure Laterality Date   BREAST REDUCTION SURGERY     DILITATION & CURRETTAGE/HYSTROSCOPY WITH HYDROTHERMAL ABLATION N/A 10/02/2016   Procedure: DILATATION & CURETTAGE/HYSTEROSCOPY WITH HYDROTHERMAL ABLATION;  Surgeon: Mat Browning, MD;  Location: [facility];  Service: Gynecology;  Laterality: N/A;  PT HAS HISTORY  OF VARIEGATE PORPHYRIA HTA rep will be here confirmed on 09/07/16   TYMPANOSTOMY TUBE PLACEMENT     WISDOM TOOTH EXTRACTION      Allergies  Allergies[1]  Home Medications    Prior to Admission medications  Medication Sig Start Date End Date Taking? Authorizing Provider  acetaminophen  (TYLENOL ) 500 MG tablet Take 500-1,000 mg by mouth every 6 (six) hours as needed for moderate pain.    [provider]  amLODipine  (NORVASC ) 5 MG tablet TAKE 1 TABLET (5 MG TOTAL) BY MOUTH DAILY. 07/02/23 06/26/24  Lonni Slain, MD  carvedilol  (COREG ) 12.5 MG tablet TAKE 1 TABLET BY MOUTH TWICE A DAY 12/24/23   Lonni Slain,  MD  cetirizine (ZYRTEC) 10 MG tablet Take 10 mg by mouth daily.    [provider]  cholecalciferol (VITAMIN D) 1000 units tablet Take 2,000 Units by mouth 2 (two) times a week. Patient not taking: Reported on 11/02/2023    [provider]  ferrous sulfate 325 (65 FE) MG EC tablet     [provider]  fluticasone (FLONASE) 50 MCG/ACT nasal spray Place 1 spray into both nostrils every other day.    [provider]  melatonin 5 MG TABS Take 5 mg by mouth as directed. 2 times a week    [provider]  meloxicam (MOBIC) 7.5 MG tablet Take 7.5 mg by mouth as needed for pain. 11/21/21   [provider]  methocarbamol (ROBAXIN) 500 MG tablet Take 500-1,000 mg by mouth every 6 (six) hours as needed. 09/02/20   [provider]  olopatadine (PATADAY) 0.1 % ophthalmic solution Place 1 drop into both eyes as needed.    [provider]  valsartan  (DIOVAN ) 160 MG tablet TAKE 1 TABLET BY MOUTH EVERY DAY 12/06/23   Lonni Slain, MD    Physical Exam    Vital Signs:  Zahniya Zellars does not have vital signs available for review today.  Given telephonic nature of communication, physical exam is limited. AAOx3. NAD. Normal affect.  Speech and respirations are unlabored.  Accessory Clinical Findings    None  Assessment & Plan    1.  Preoperative Cardiovascular Risk Assessment:  Due to recent events and ER visit, we have arranged for an Echo and in person evaluation for her clearance to be addressed.    A copy of this note will be routed to requesting surgeon.  Time:   Today, I have spent 14 minutes with the patient with telehealth technology discussing medical history, symptoms, and management plan.     [name] Fabry, PA-C  02/08/2024, 8:56 AM      [1]  Allergies Allergen Reactions   Codeine Nausea And Vomiting    Vomits to point of tearing stomach lining, causing bleeding    Erythromycin Anaphylaxis and  Swelling    Any mycins including Neomycin.    Neomycin Anaphylaxis and Swelling   Vancomycin Other (See Comments)    Erythromycin - Anaphylaxis   Amoxicillin Rash   Latex Itching    Severity increases when in contact with moisture: fluid, sweat, etc.    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    "

## 2024-02-08 NOTE — Addendum Note (Signed)
 Addended by: WYNETTA NIELS HERO on: 02/08/2024 10:59 AM   Modules accepted: Orders

## 2024-02-21 ENCOUNTER — Ambulatory Visit (HOSPITAL_COMMUNITY)
Admission: RE | Admit: 2024-02-21 | Discharge: 2024-02-21 | Disposition: A | Source: Ambulatory Visit | Attending: Family Medicine | Admitting: Family Medicine

## 2024-02-21 ENCOUNTER — Encounter (HOSPITAL_COMMUNITY): Payer: Self-pay | Admitting: Physician Assistant

## 2024-02-21 DIAGNOSIS — Z01810 Encounter for preprocedural cardiovascular examination: Secondary | ICD-10-CM | POA: Insufficient documentation

## 2024-02-21 DIAGNOSIS — F17210 Nicotine dependence, cigarettes, uncomplicated: Secondary | ICD-10-CM | POA: Insufficient documentation

## 2024-02-21 DIAGNOSIS — R600 Localized edema: Secondary | ICD-10-CM | POA: Diagnosis not present

## 2024-02-21 DIAGNOSIS — I10 Essential (primary) hypertension: Secondary | ICD-10-CM | POA: Insufficient documentation

## 2024-02-21 DIAGNOSIS — Z01818 Encounter for other preprocedural examination: Secondary | ICD-10-CM | POA: Diagnosis present

## 2024-02-21 LAB — ECHOCARDIOGRAM COMPLETE
Area-P 1/2: 4.21 cm2
S' Lateral: 2.2 cm
Single Plane A4C EF: 69.6 %

## 2024-02-21 NOTE — Progress Notes (Signed)
" °  Echocardiogram 2D Echocardiogram has been performed.  Koleen KANDICE Popper, RDCS 02/21/2024, 2:37 PM "

## 2024-02-22 ENCOUNTER — Ambulatory Visit: Payer: Self-pay | Admitting: Physician Assistant

## 2024-02-25 ENCOUNTER — Ambulatory Visit (HOSPITAL_BASED_OUTPATIENT_CLINIC_OR_DEPARTMENT_OTHER): Admitting: Family

## 2024-02-25 ENCOUNTER — Encounter (HOSPITAL_BASED_OUTPATIENT_CLINIC_OR_DEPARTMENT_OTHER): Payer: Self-pay | Admitting: Family

## 2024-02-25 VITALS — BP 138/84 | HR 71 | Ht 63.5 in | Wt 187.0 lb

## 2024-02-25 DIAGNOSIS — R002 Palpitations: Secondary | ICD-10-CM | POA: Diagnosis not present

## 2024-02-25 DIAGNOSIS — I10 Essential (primary) hypertension: Secondary | ICD-10-CM | POA: Diagnosis not present

## 2024-02-25 DIAGNOSIS — R600 Localized edema: Secondary | ICD-10-CM | POA: Diagnosis not present

## 2024-02-25 DIAGNOSIS — I773 Arterial fibromuscular dysplasia: Secondary | ICD-10-CM

## 2024-02-25 MED ORDER — FUROSEMIDE 20 MG PO TABS: ORAL_TABLET | ORAL | 1 refills | Status: DC

## 2024-02-25 NOTE — Patient Instructions (Signed)
 Medication Instructions:   Your physician recommends that you continue on your current medications as directed. Please refer to the Current Medication list given to you today.  *If you need a refill on your cardiac medications before your next appointment, please call your pharmacy*   Follow-Up:  1.)  NURSE VISIT APPOINTMENT --NO SPECIFIC TIME FRAME, WHENEVER PATIENT REQUEST APPOINTMENT, MUST BE AN OPEN SLOT ON NURSE VISIT SCHEDULE--THIS IS TO CHECK HER BLOOD PRESSURE CUFF  2.) Your next appointment:   6 month(s)  Provider:   Shelda Bruckner, MD, Rosaline Bane, NP, or Reche Finder, NP      Other Instructions   Reche GORMAN Finder, NP will send you a MyChart message in one week to check in on your blood pressure.  If it is consistently high, we could consider increasing your Carvedilol  to 1.5 tablet twice per day.              Reche GORMAN Finder, NP will send you a MyChart message in one week to check in on your blood pressure.  If it is consistently high, we could consider increasing your Carvedilol  to 1.5 tablet twice per day.

## 2024-02-25 NOTE — Progress Notes (Signed)
 " Cardiology Office Note   Date:  02/25/2024  ID:  Wendy Evans, DOB Mar 26, 1975, MRN [id number] PCP: Mahlon Comer BRAVO, MD  [facility] Providers Cardiologist:  Shelda Bruckner, MD     History of Present Illness Wendy Evans is a 49 y.o. female with a history of variegate porphyria, FMD of R renal artery, HTN, tobacco use, palpitations.   She is adopted but does know of some family history.  Father passed away at 20 during surgery for aortic rupture and also hypertension.  Biological mother with high cholesterol.  Biologic grandmother passed away at 30 after having pig valve in her 26s that lasted until the end of her life, vascular dementia, TIA.  Sister with cutaneous porphyria.   Diagnosis of hypertension 2022.  Vascular duplex 09/2020 at [facility] with elevated velocities/upper limit of normal ratio and right renal artery.  CTA 11/2020 somewhat beaded appearance of right renal artery with focal area of mild narrowing consistent with FMD.  ED visit 12/27/23 with palpitations, mid chest discomfort.  Seen via phone visit 02/08/2024 for preop clearance.  She noted lower extremity edema and echo and in person follow-up recommended prior to preop clearance.Echocardiogram 02/21/2024 LVEF 63% by 3D volume, no RWMA, normal strain, RV normal, no significant valvular abnormalities.    Presents today for follow-up.  She is pending laparoscopically assisted vaginal hysterectomy with bilateral salpingo-oophorectomy. She has not had an episode like what sent her to the emergency room.   Typically swells after flying to [place] for one day but then improves after a day. This time it recurred after it improved. Her mom gave her some of her Lasix . She did wear compression socks but they were not to her usual strength. Notes her blood pressure reading has been high since her ED visit with BP 150-160s/90s. HR at home 140-160s. HR at home 80-90s. Takes her carvedilol  consistently at night but notes she  sometimes misses in the morning. She is working to be more consistent in the mornings.   ROS: Please see the history of present illness.    All other systems reviewed and are negative.   Studies Reviewed      Cardiac Studies & Procedures   ______________________________________________________________________________________________     ECHOCARDIOGRAM  ECHOCARDIOGRAM COMPLETE 02/21/2024  Narrative ECHOCARDIOGRAM REPORT    Patient Name:   Wendy Evans Date of Exam: 02/21/2024 Medical Rec #:  [id number]     Height:       63.0 in Accession #:    [id number]    Weight:       184.0 lb Date of Birth:  [birth year]     BSA:          1.866 m Patient Age:    48 years      BP:           159/96 mmHg Patient Gender: F             HR:           69 bpm. Exam Location:  Outpatient  Procedure: 2D Echo, 3D Echo, Cardiac Doppler, Color Doppler and Strain Analysis (Both Spectral and Color Flow Doppler were utilized during procedure).  Indications:    Edema  History:        Patient has no prior history of Echocardiogram examinations. Signs/Symptoms:Edema; Risk Factors:Hypertension and Current Smoker.  Sonographer:    Koleen Popper RDCS Referring Phys: [id number] TESSA N CONTE   Sonographer Comments: Image acquisition challenging due to respiratory motion. Global longitudinal strain  was attempted. IMPRESSIONS   1. Left ventricular ejection fraction, by estimation, is 70 to 75%. Left ventricular ejection fraction by 3D volume is 63 %. The left ventricle has hyperdynamic function. The left ventricle has no regional wall motion abnormalities. Left ventricular diastolic parameters were normal. The average left ventricular global longitudinal strain is -26.5 %. The global longitudinal strain is normal. 2. Right ventricular systolic function is normal. The right ventricular size is normal. 3. The mitral valve is normal in structure. No evidence of mitral valve regurgitation. No evidence of mitral  stenosis. 4. The aortic valve was not well visualized. Aortic valve regurgitation is not visualized. No aortic stenosis is present. 5. The inferior vena cava is normal in size with greater than 50% respiratory variability, suggesting right atrial pressure of 3 mmHg.  FINDINGS Left Ventricle: Left ventricular ejection fraction, by estimation, is 70 to 75%. Left ventricular ejection fraction by 3D volume is 63 %. The left ventricle has hyperdynamic function. The left ventricle has no regional wall motion abnormalities. The average left ventricular global longitudinal strain is -26.5 %. Strain was performed and the global longitudinal strain is normal. The left ventricular internal cavity size was normal in size. There is no left ventricular hypertrophy. Left ventricular diastolic parameters were normal.  Right Ventricle: The right ventricular size is normal. No increase in right ventricular wall thickness. Right ventricular systolic function is normal.  Left Atrium: Left atrial size was normal in size.  Right Atrium: Right atrial size was normal in size.  Pericardium: There is no evidence of pericardial effusion.  Mitral Valve: The mitral valve is normal in structure. No evidence of mitral valve regurgitation. No evidence of mitral valve stenosis.  Tricuspid Valve: The tricuspid valve is normal in structure. Tricuspid valve regurgitation is trivial. No evidence of tricuspid stenosis.  Aortic Valve: The aortic valve was not well visualized. Aortic valve regurgitation is not visualized. No aortic stenosis is present.  Pulmonic Valve: The pulmonic valve was normal in structure. Pulmonic valve regurgitation is not visualized. No evidence of pulmonic stenosis.  Aorta: The aortic root is normal in size and structure.  Venous: The inferior vena cava is normal in size with greater than 50% respiratory variability, suggesting right atrial pressure of 3 mmHg.  IAS/Shunts: No atrial level shunt  detected by color flow Doppler.  Additional Comments: 3D was performed not requiring image post processing on an independent workstation and was normal.   LEFT VENTRICLE PLAX 2D LVIDd:         3.90 cm         Diastology LVIDs:         2.20 cm         LV e' medial:    9.79 cm/s LV PW:         0.80 cm         LV E/e' medial:  8.7 LV IVS:        0.80 cm         LV e' lateral:   14.90 cm/s LVOT diam:     1.80 cm         LV E/e' lateral: 5.7 LV SV:         70 LV SV Index:   38              2D Longitudinal LVOT Area:     2.54 cm        Strain 2D Strain GLS   -26.5 % Avg: LV Volumes (MOD)  LV vol d, MOD    99.5 ml       3D Volume EF A4C:                           LV 3D EF:    Left LV vol s, MOD    30.2 ml                    ventricul A4C:                                        ar LV SV MOD A4C:   99.5 ml                    ejection fraction by 3D volume is 63 %.  3D Volume EF: 3D EF:        63 % LV EDV:       99 ml LV ESV:       37 ml LV SV:        62 ml  RIGHT VENTRICLE             IVC RV Basal diam:  3.30 cm     IVC diam: 1.90 cm RV S prime:     14.50 cm/s TAPSE (M-mode): 2.5 cm  LEFT ATRIUM             Index        RIGHT ATRIUM           Index LA diam:        3.30 cm 1.77 cm/m   RA Area:     10.40 cm LA Vol (A2C):   32.2 ml 17.25 ml/m  RA Volume:   18.80 ml  10.07 ml/m LA Vol (A4C):   30.3 ml 16.24 ml/m LA Biplane Vol: 32.3 ml 17.31 ml/m AORTIC VALVE LVOT Vmax:   126.00 cm/s LVOT Vmean:  83.800 cm/s LVOT VTI:    0.277 m  AORTA Ao Root diam: 3.00 cm Ao Asc diam:  2.80 cm  MITRAL VALVE MV Area (PHT): 4.21 cm    SHUNTS MV Decel Time: 180 msec    Systemic VTI:  0.28 m MV E velocity: 84.90 cm/s  Systemic Diam: 1.80 cm MV A velocity: 63.90 cm/s MV E/A ratio:  1.33  Toribio Fuel MD Electronically signed by Toribio Fuel MD Signature Date/Time: 02/21/2024/8:40:19 PM    Final           ______________________________________________________________________________________________      Risk Assessment/Calculations           Physical Exam VS:  BP 138/84 (BP Location: Left Arm, Patient Position: Sitting, Cuff Size: Normal)   Pulse 71   Ht 5' 3.5 (1.613 m)   Wt 187 lb (84.8 kg)   SpO2 97%   BMI 32.61 kg/m        Wt Readings from Last 3 Encounters:  02/25/24 187 lb (84.8 kg)  11/02/23 184 lb (83.5 kg)  01/29/23 186 lb 14.4 oz (84.8 kg)    GEN: Well nourished, well developed in no acute distress NECK: No JVD; No carotid bruits CARDIAC: RRR, no murmurs, rubs, gallops RESPIRATORY:  Clear to auscultation without rales, wheezing or rhonchi  ABDOMEN: Soft, non-tender, non-distended EXTREMITIES:  No edema; No deformity   ASSESSMENT AND PLAN  Preop - Pending laparoscopically assisted vaginal hysterectomy with bilateral  salpingo-oophorectomy. Exercise tolerance >4 METS. Per AHA/ACC guidelines, she is deemed acceptable risk for the planned procedure without additional cardiovascular testing. Will route to surgical team so they are aware.    LE edema - Echo 02/2024 LVEF 63%, no RWMA, no significant valvular abnormalities. Her recent LE edema was after travel. Continued use of compressions stockings with travel advised. Will Rx Lasix  20mg  tablet 1-2 tablets PRN for LE edema with notation to contact us  if requiring for more than 3 days.    HTN / Mild R renal FMD - recommendation for ARB with RAS/FMD, previously reviewed by Dr. Lonni on [place] registry and acceptable. BP elevated since mid November per her report but improved by clinic reading. She will monitor once per day for 1 week and check in via MyChart. In the interim, continue amlodipine  5mg  daily, valsaratn 160mg  daily, coreg  12.5mg  BID. If BP elevated via MyChart - will increase Coreg  dose (may prefer 18.75mg  BID dose instead of 25mg  BID dose)  Tobacco use - Smoking cessation encouraged. Previously endorsed  concern about potential lack of autoimmune suppression if she quits.   Variegate porphyria - Medications need to be checked prior to prescribing to make sure they will not worsen her symptoms. [place] list most accurate per patient.       Dispo: follow up in 6 mos  Signed, Reche GORMAN Finder, NP   "

## 2024-03-02 ENCOUNTER — Encounter: Payer: Self-pay | Admitting: Internal Medicine

## 2024-03-07 ENCOUNTER — Other Ambulatory Visit: Payer: Self-pay | Admitting: Cardiology

## 2024-03-07 DIAGNOSIS — I10 Essential (primary) hypertension: Secondary | ICD-10-CM

## 2024-03-08 ENCOUNTER — Other Ambulatory Visit (HOSPITAL_BASED_OUTPATIENT_CLINIC_OR_DEPARTMENT_OTHER): Payer: Self-pay | Admitting: Family

## 2024-03-08 DIAGNOSIS — R600 Localized edema: Secondary | ICD-10-CM

## 2024-03-08 MED ORDER — AMLODIPINE BESYLATE 5 MG PO TABS: 5.0000 mg | ORAL_TABLET | Freq: Every day | ORAL | 2 refills | Status: AC

## 2024-05-19 ENCOUNTER — Ambulatory Visit (HOSPITAL_BASED_OUTPATIENT_CLINIC_OR_DEPARTMENT_OTHER)
